# Patient Record
Sex: Female | Born: 1950
Health system: Southern US, Community
[De-identification: ages and names within clinical notes are randomized; demographics above are authoritative.]

## PROBLEM LIST (undated history)

## (undated) DIAGNOSIS — N898 Other specified noninflammatory disorders of vagina: Secondary | ICD-10-CM

## (undated) DIAGNOSIS — Z23 Encounter for immunization: Secondary | ICD-10-CM

## (undated) DIAGNOSIS — I10 Essential (primary) hypertension: Secondary | ICD-10-CM

## (undated) DIAGNOSIS — K2100 Gastro-esophageal reflux disease with esophagitis, without bleeding: Secondary | ICD-10-CM

## (undated) DIAGNOSIS — C4490 Unspecified malignant neoplasm of skin, unspecified: Secondary | ICD-10-CM

## (undated) DIAGNOSIS — I4891 Unspecified atrial fibrillation: Secondary | ICD-10-CM

## (undated) DIAGNOSIS — E559 Vitamin D deficiency, unspecified: Secondary | ICD-10-CM

## (undated) DIAGNOSIS — D485 Neoplasm of uncertain behavior of skin: Secondary | ICD-10-CM

## (undated) DIAGNOSIS — E063 Autoimmune thyroiditis: Secondary | ICD-10-CM

## (undated) DIAGNOSIS — R7309 Other abnormal glucose: Secondary | ICD-10-CM

## (undated) DIAGNOSIS — E785 Hyperlipidemia, unspecified: Secondary | ICD-10-CM

## (undated) DIAGNOSIS — R195 Other fecal abnormalities: Secondary | ICD-10-CM

## (undated) DIAGNOSIS — M7989 Other specified soft tissue disorders: Secondary | ICD-10-CM

## (undated) DIAGNOSIS — F32A Depression, unspecified: Secondary | ICD-10-CM

## (undated) DIAGNOSIS — J45902 Unspecified asthma with status asthmaticus: Secondary | ICD-10-CM

## (undated) DIAGNOSIS — N952 Postmenopausal atrophic vaginitis: Secondary | ICD-10-CM

## (undated) DIAGNOSIS — F419 Anxiety disorder, unspecified: Secondary | ICD-10-CM

## (undated) DIAGNOSIS — I509 Heart failure, unspecified: Secondary | ICD-10-CM

## (undated) DIAGNOSIS — E039 Hypothyroidism, unspecified: Secondary | ICD-10-CM

## (undated) DIAGNOSIS — L129 Pemphigoid, unspecified: Secondary | ICD-10-CM

## (undated) DIAGNOSIS — J45909 Unspecified asthma, uncomplicated: Secondary | ICD-10-CM

## (undated) DIAGNOSIS — F329 Major depressive disorder, single episode, unspecified: Secondary | ICD-10-CM

## (undated) DIAGNOSIS — Z79899 Other long term (current) drug therapy: Secondary | ICD-10-CM

## (undated) DIAGNOSIS — Z6826 Body mass index (BMI) 26.0-26.9, adult: Secondary | ICD-10-CM

## (undated) HISTORY — DX: Other abnormal glucose: R73.09

## (undated) HISTORY — DX: Unspecified asthma, uncomplicated: J45.909

## (undated) HISTORY — DX: Heart failure, unspecified: I50.9

## (undated) HISTORY — DX: Postmenopausal atrophic vaginitis: N95.2

## (undated) HISTORY — DX: Hypothyroidism, unspecified: E03.9

## (undated) HISTORY — DX: Other specified noninflammatory disorders of vagina: N89.8

## (undated) HISTORY — PX: SQUAMOUS CELL CARCINOMA EXCISION: SHX2433

## (undated) HISTORY — DX: Depression, unspecified: F32.A

## (undated) HISTORY — DX: Neoplasm of uncertain behavior of skin: D48.5

## (undated) HISTORY — DX: Major depressive disorder, single episode, unspecified: F32.9

## (undated) HISTORY — PX: OTHER SURGICAL HISTORY: SHX169

## (undated) HISTORY — DX: Unspecified malignant neoplasm of skin, unspecified: C44.90

## (undated) HISTORY — DX: Unspecified atrial fibrillation: I48.91

## (undated) HISTORY — DX: Hyperlipidemia, unspecified: E78.5

## (undated) HISTORY — DX: Autoimmune thyroiditis: E06.3

## (undated) HISTORY — DX: Gastro-esophageal reflux disease with esophagitis, without bleeding: K21.00

## (undated) HISTORY — DX: Anxiety disorder, unspecified: F41.9

## (undated) HISTORY — DX: Other fecal abnormalities: R19.5

## (undated) HISTORY — DX: Encounter for immunization: Z23

## (undated) HISTORY — DX: Vitamin D deficiency, unspecified: E55.9

## (undated) HISTORY — DX: Essential (primary) hypertension: I10

## (undated) HISTORY — DX: Other specified soft tissue disorders: M79.89

## (undated) HISTORY — DX: Body mass index (BMI) 26.0-26.9, adult: Z68.26

## (undated) HISTORY — DX: Gastro-esophageal reflux disease with esophagitis: K21.0

## (undated) HISTORY — DX: Other long term (current) drug therapy: Z79.899

## (undated) HISTORY — DX: Pemphigoid, unspecified: L12.9

## (undated) HISTORY — DX: Unspecified asthma with status asthmaticus: J45.902

---
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
## 1972-03-02 HISTORY — PX: HERNIA REPAIR: SHX51

## 1980-03-02 HISTORY — PX: ABDOMINAL HYSTERECTOMY: SHX81

## 1998-01-02 ENCOUNTER — Inpatient Hospital Stay (HOSPITAL_COMMUNITY): Admission: EM | Admit: 1998-01-02 | Discharge: 1998-01-03 | Payer: Self-pay | Admitting: Emergency Medicine

## 2007-05-01 ENCOUNTER — Emergency Department (HOSPITAL_COMMUNITY): Admission: EM | Admit: 2007-05-01 | Discharge: 2007-05-01 | Payer: Self-pay | Admitting: Emergency Medicine

## 2007-05-06 ENCOUNTER — Ambulatory Visit (HOSPITAL_COMMUNITY): Admission: RE | Admit: 2007-05-06 | Discharge: 2007-05-06 | Payer: Self-pay | Admitting: Orthopaedic Surgery

## 2007-05-13 ENCOUNTER — Ambulatory Visit (HOSPITAL_COMMUNITY): Admission: RE | Admit: 2007-05-13 | Discharge: 2007-05-14 | Payer: Self-pay | Admitting: Orthopaedic Surgery

## 2009-09-03 IMAGING — CR DG ANKLE COMPLETE 3+V*R*
3 series · 3 of 3 positions shown · non-contrast
Comparison: none

CLINICAL DATA: Fall. 
 RIGHT ANKLE ? 3 VIEW ([DATE] HOURS):

[t ankle joint ap right]
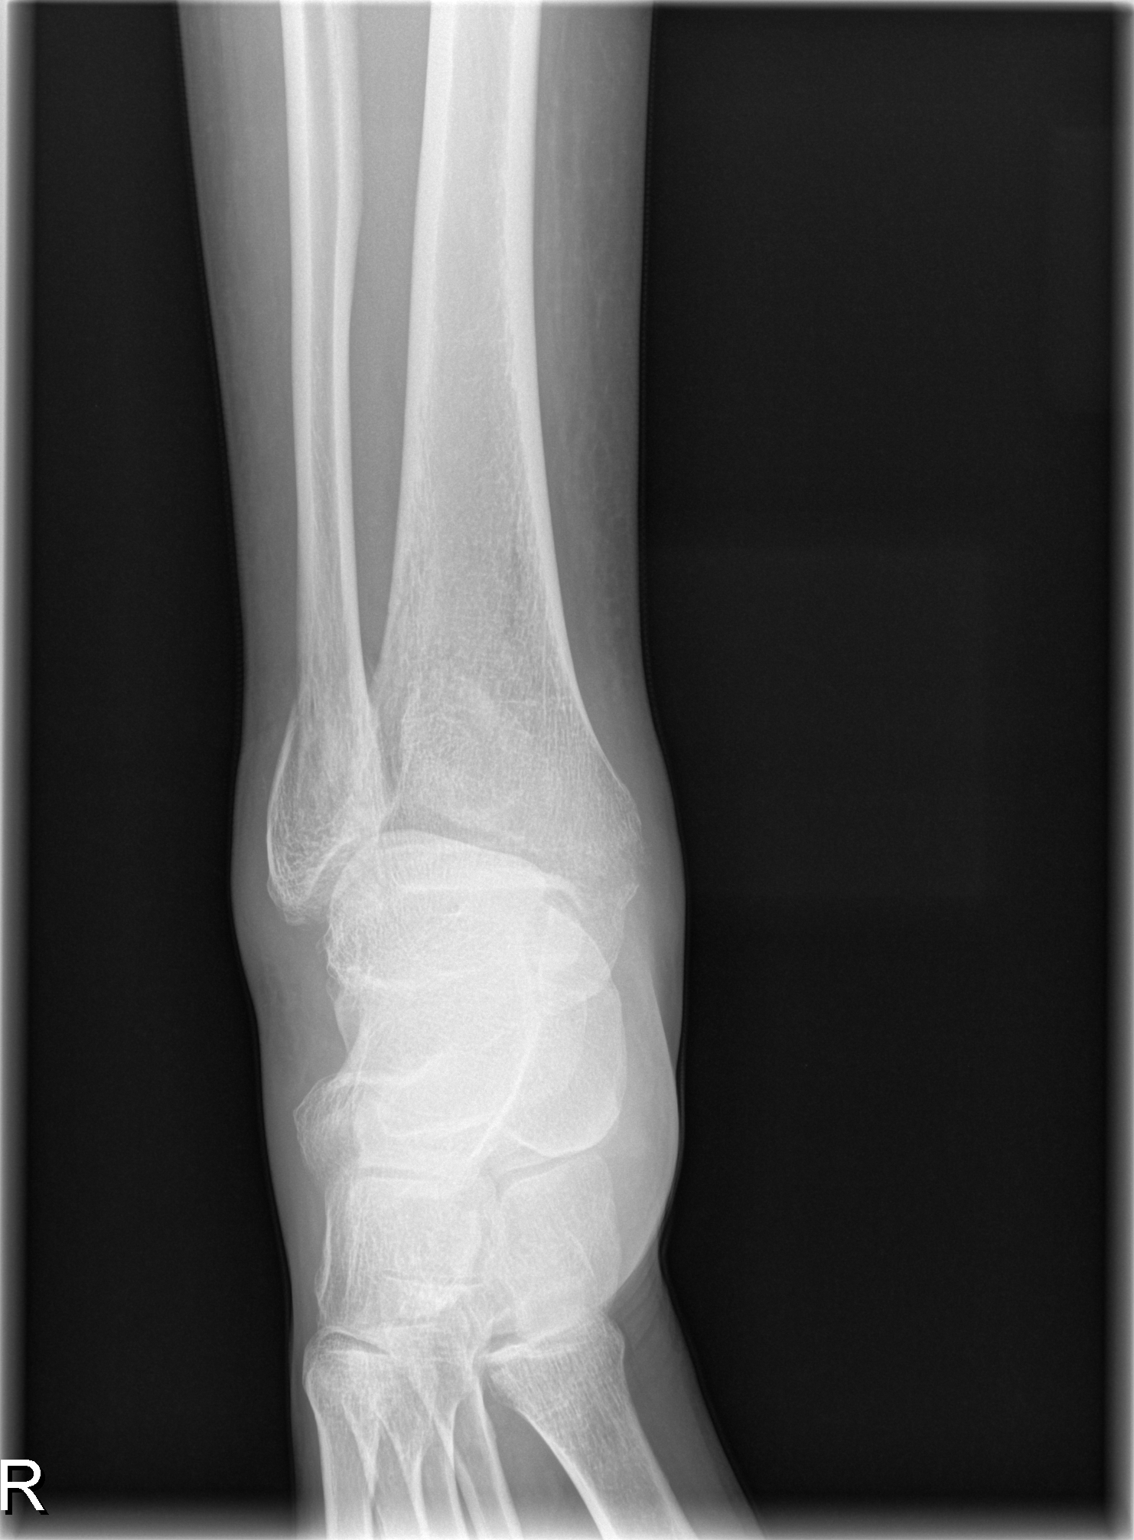

[t ankle joint oblique right]
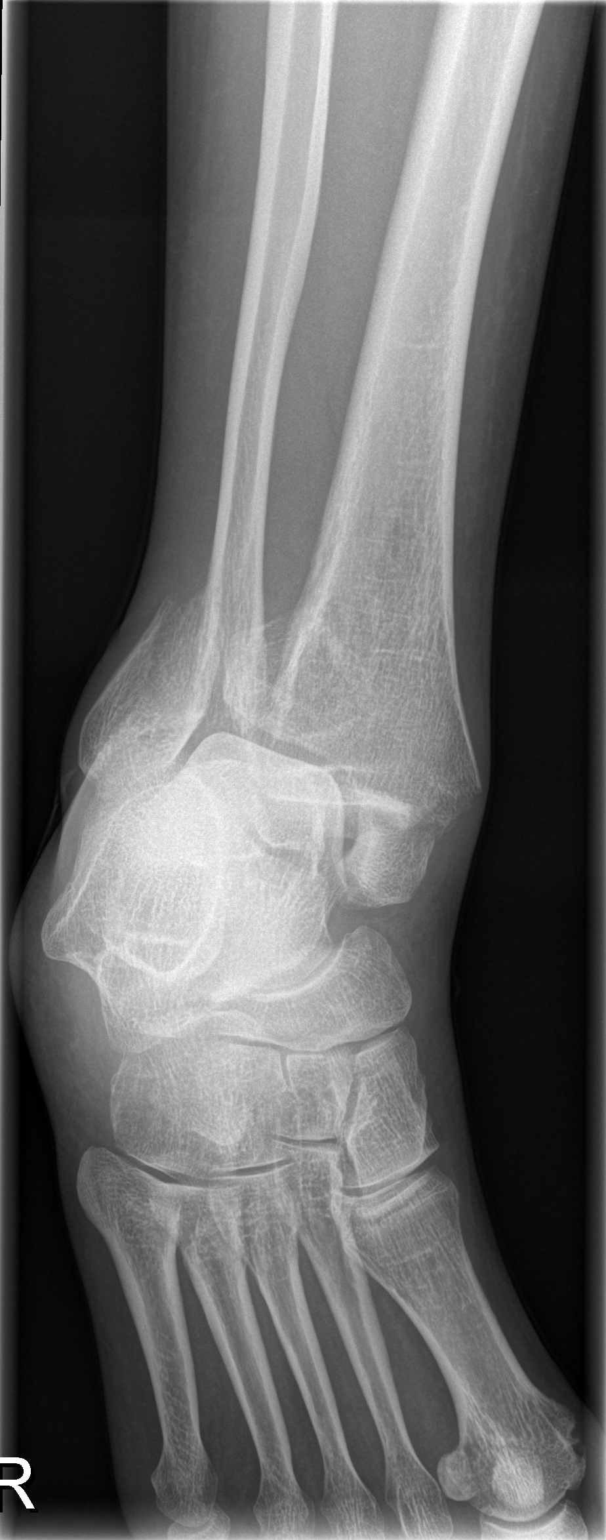

[t ankle joint lat right]
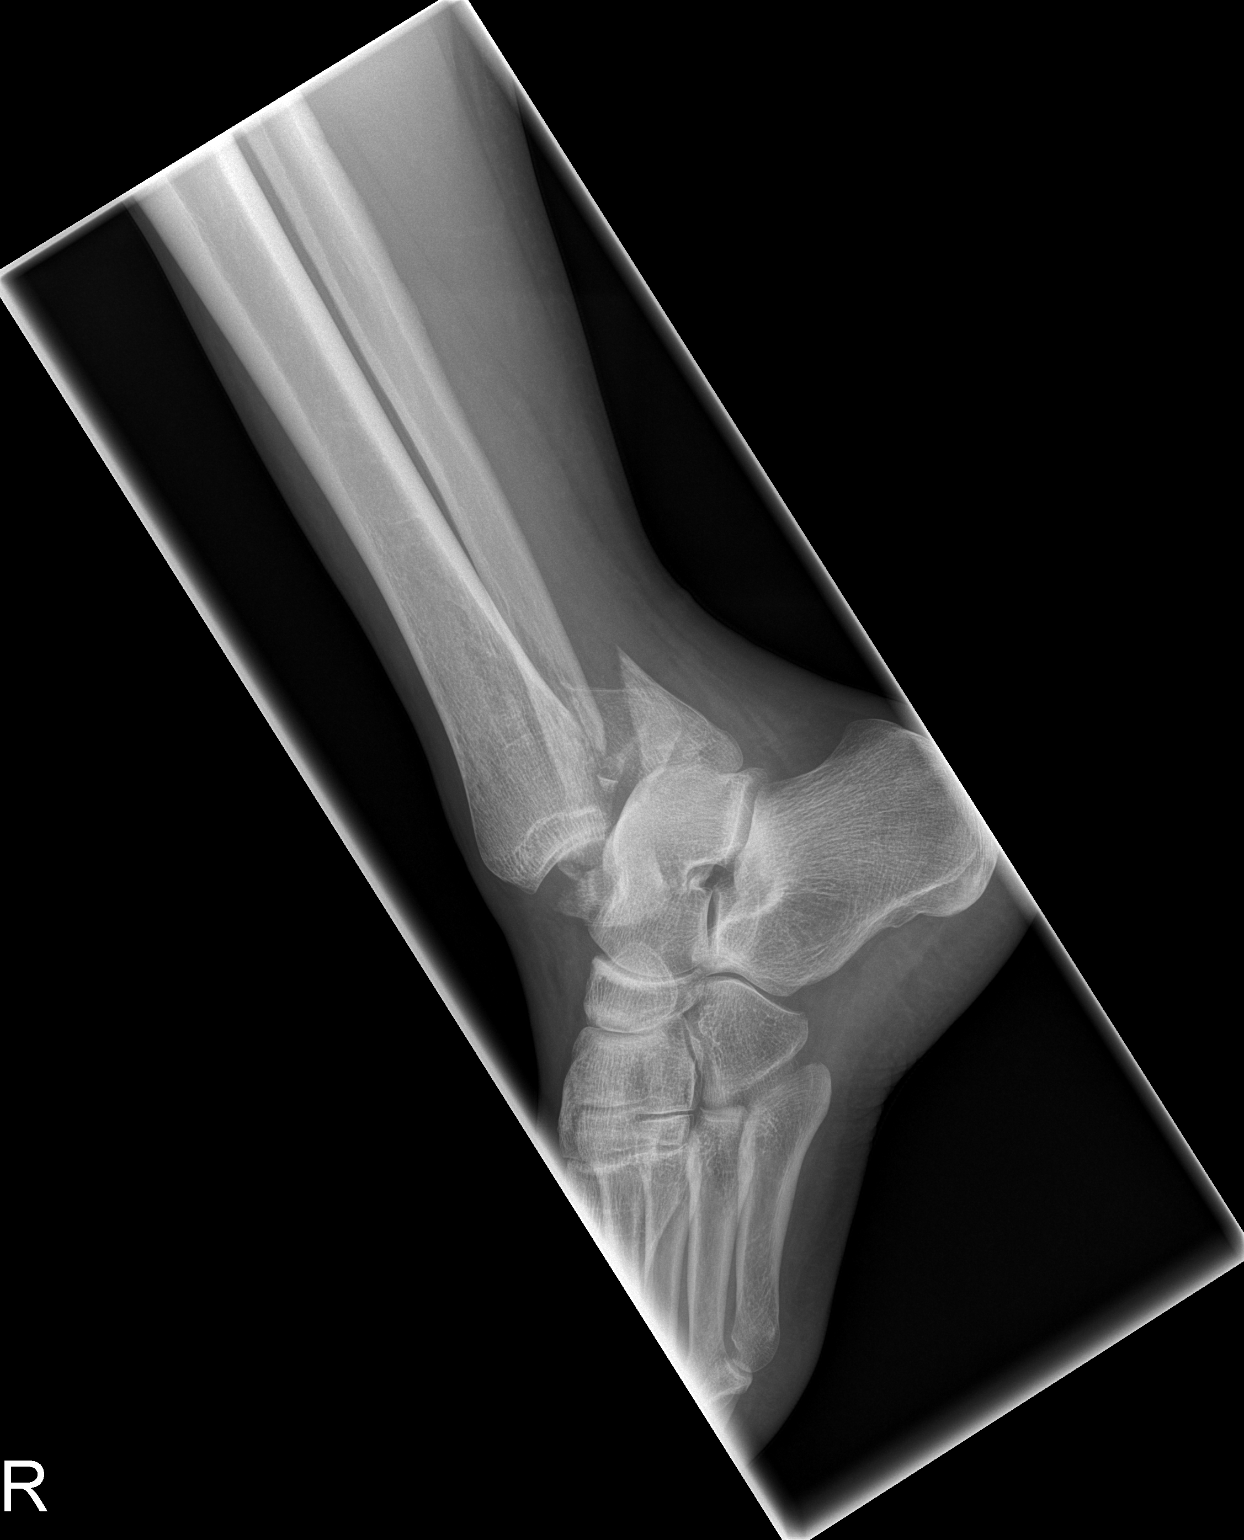

[3 of 3 positions shown; findings below may reference images not displayed]

FINDINGS: There is a complex fracture and dislocation of the ankle joint.  A distal fibula fracture is seen extending from the tibial plafond superiorly and posteriorly.  It is comminuted.  There is also a fracture of the medial malleolus and posterior malleolus with marked displacement.  The tibial plafond is subluxated 1 ?? medially with respect to the talar dome.  The calcaneus and talus are grossly intact.  Metatarsals are intact.
IMPRESSION: Complex trimalleolar fracture and dislocation of the ankle joint.

## 2009-09-08 IMAGING — CR DG CHEST 2V
2 series · 2 of 2 positions shown · non-contrast
Comparison: none

CLINICAL DATA: Preop respiratory exam for trimalleolar fracture.  Asthma.
 CHEST X-RAY:
 Two views of the chest show the lungs to be clear.  The heart is within normal limits in size.  No bony abnormality is seen.  The lungs are hyperaerated suggestive of COPD.

[view not recorded (1 of 2)]
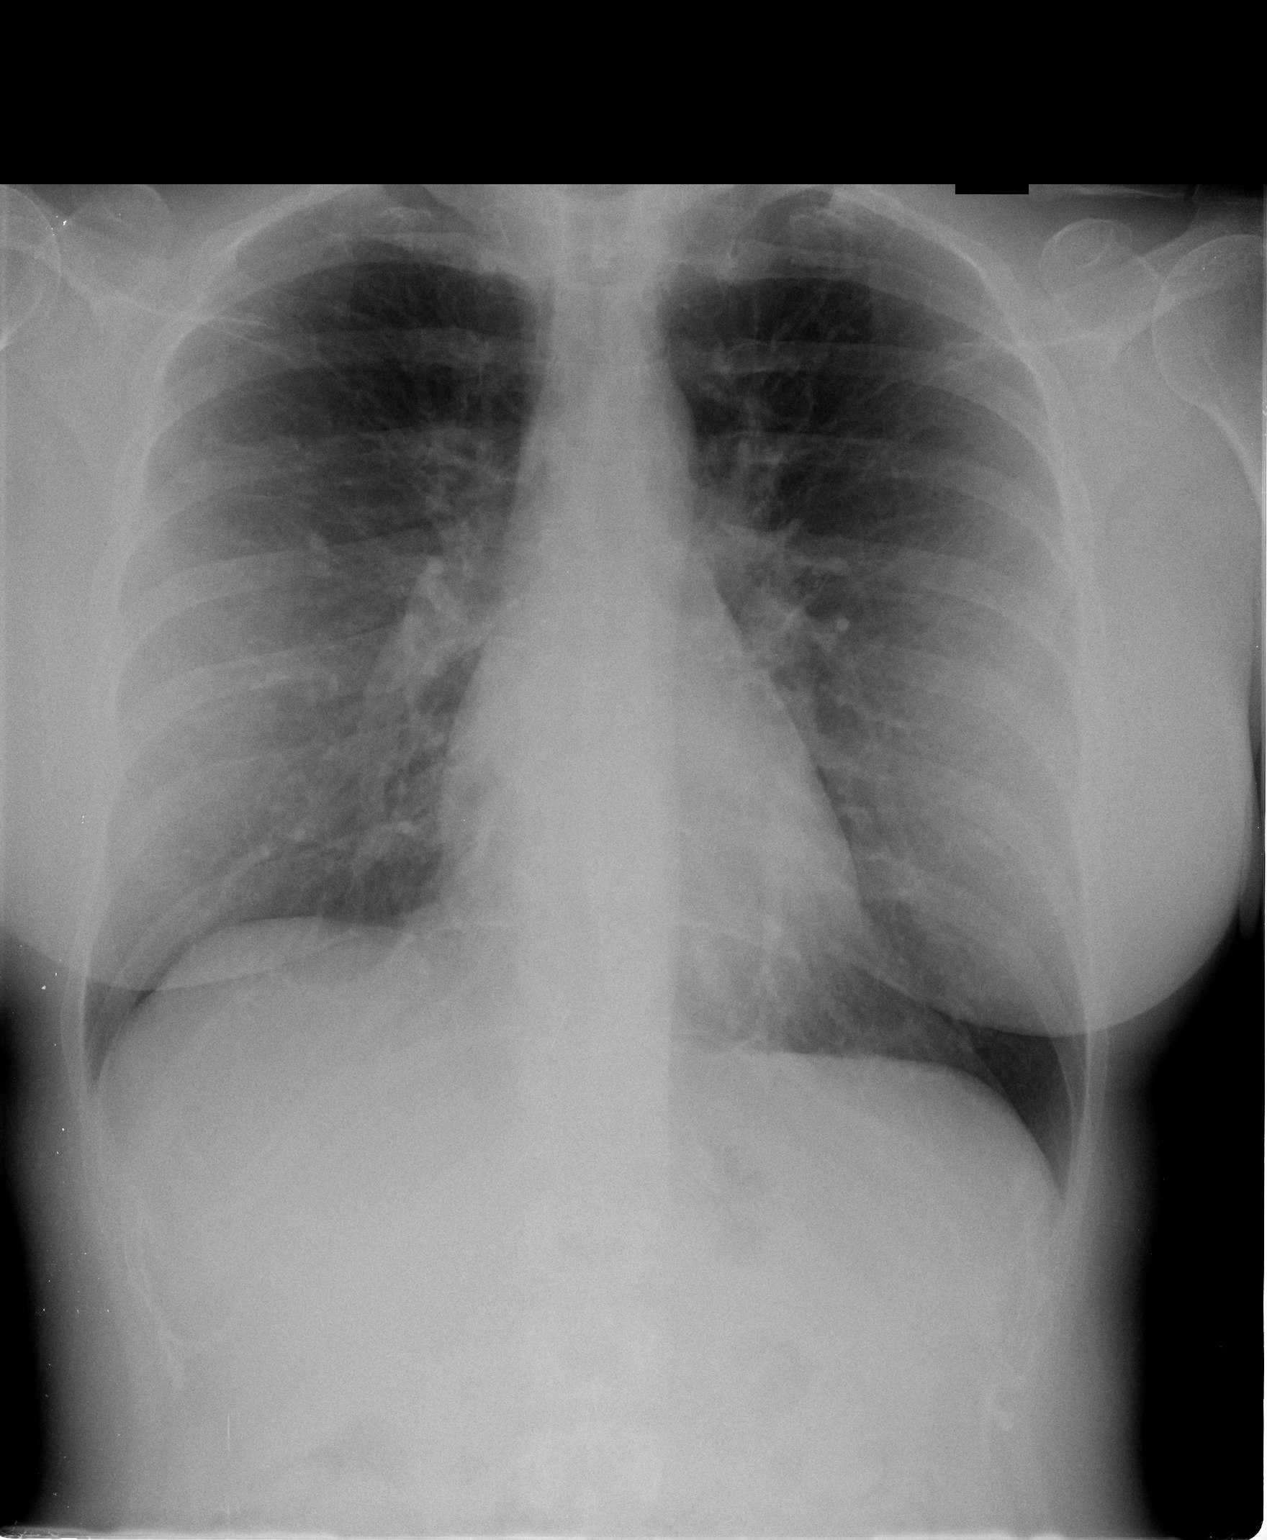

[view not recorded (2 of 2)]
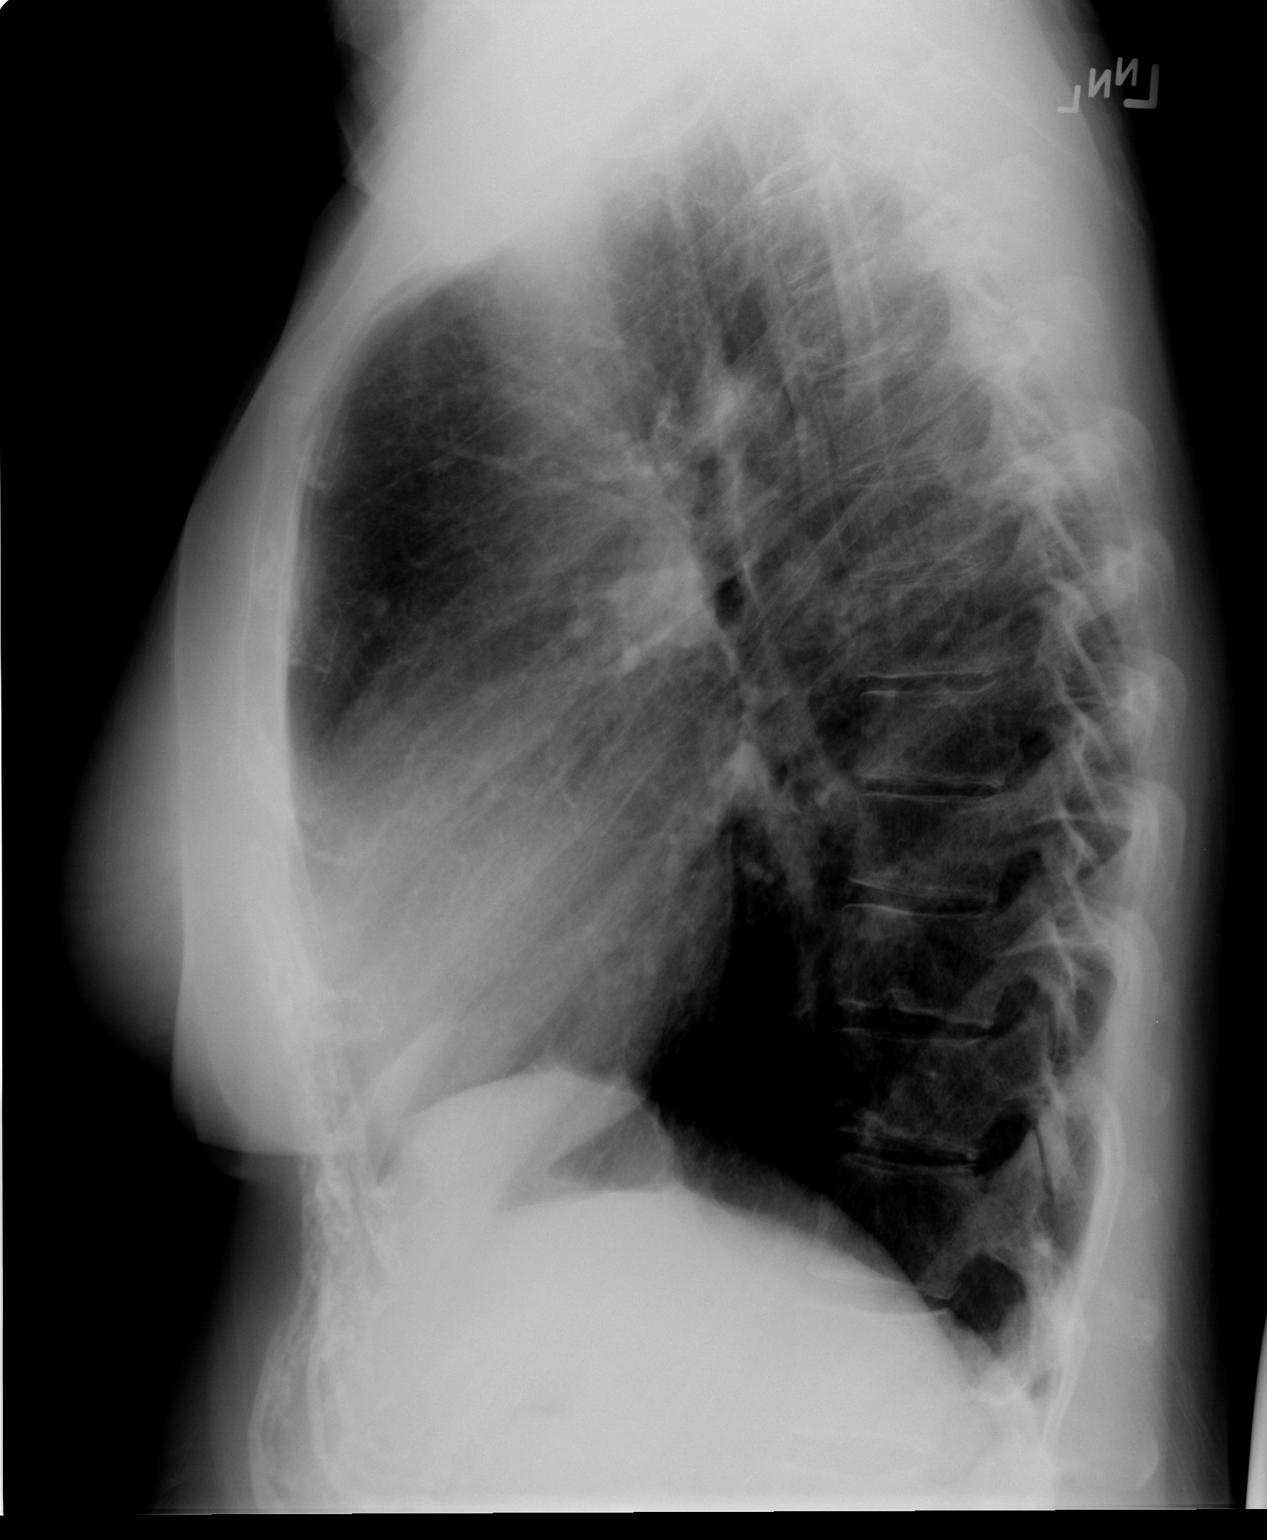

[2 of 2 positions shown; findings below may reference images not displayed]

IMPRESSION: No active lung disease.  Hyperaeration.

## 2010-07-15 NOTE — Op Note (Signed)
NAMEOREAN, GIARRATANO              ACCOUNT NO.:  [id number]   MEDICAL RECORD NO.:  [id number]          PATIENT TYPE:  AMB   LOCATION:  SDS                          FACILITY:  [facility]   PHYSICIAN:  Vanita Panda. Magnus Ivan, M.D.DATE OF BIRTH:  04/09/1950   DATE OF PROCEDURE:  05/06/2007  DATE OF DISCHARGE:  05/06/2007                               OPERATIVE REPORT   PREOPERATIVE DIAGNOSIS:  Severe trimalleolar ankle fracture dislocation  status post closed reduction.   POSTOPERATIVE DIAGNOSIS:  1. Severe trimalleolar ankle fracture dislocation status post closed      reduction.  2. Severe full thickness fracture blister circumferentially around      right ankle.   PROCEDURE:  Unroofing of fracture blisters with Xeroform and well padded  splint placement.   SURGEON:  Vanita Panda. Magnus Ivan, M.D.   ANESTHESIA:  1. Right leg popliteal block.  2. General.   COMPLICATIONS:  None.   INDICATIONS:  Briefly, Ms. Fritchman is a 60 year old female who this  past Sunday 5 days ago sustained a fracture dislocation to her right  ankle.  She was found to have a trimalleolar ankle fracture.  This  injury occurred around 10 a.m.  She did not present to our emergency  room some time until after noon and x-rays were obtained apparently  after 1.  I was called after 2:00 in the afternoon and came about 15  minutes.  Directly after seeing her I was able to perform a block in the  ankle with some Marcaine and easily reduced the ankle.  I placed her in  a well padded splint and due to the swelling she had in her ankle set  her up for surgery 5 days later to allow for elevation and the swelling  to reduce.  She said she was doing well over this time and wanted to  proceed with surgery.   PROCEDURE IN DETAIL:  A popliteal block was obtained after marking the  right ankle but I did not take down the splint due to the unstable  nature of her injury prior to taking her back to the operating room.  The  risks and benefits of the surgery were explained to her and she  agreed to proceed with surgery.   After informed consent was obtained appropriate right leg was marked and  popliteal block was obtained and then she was brought to the operating  room and laid supine on the operating table.  General anesthesia was  obtained via an LMA then I took down the splint and noticed  circumferential fracture blisters that were almost full thickness around  the ankle at all areas where incisions were planned.  Thus I felt the  need to abort the case at that standpoint because I could not safely  make skin incisions through these blisters.  I used a small needle and  evacuated the fluid from all the fracture blisters and unroofed the  tissue.  I placed Xeroform over these and then assessed the fracture  under direct fluoroscopy and had it in a completely reduced position.  I  placed an abundant amount  of well padded dressing on the ankle and  placed her back into a plaster splint.  She was awakened, extubated and  taken to the recovery room in stable condition.  Postoperatively I  talked to her and her husband about allowing the soft tissue  wounds to calm down before proceeding with surgery in at least another  week.  Again, the risks and benefits of this were explained to her and  well understood and she agreed to allow me to delay the surgery until a  better time when the soft tissue would allow for incisions.      Vanita Panda. Magnus Ivan, M.D.  Electronically Signed     CYB/MEDQ  D:  05/06/2007  T:  05/08/2007  Job:  [id number]

## 2010-07-15 NOTE — Op Note (Signed)
NAMESARY, BOGIE              ACCOUNT NO.:  [id number]   MEDICAL RECORD NO.:  [id number]          PATIENT TYPE:  OIB   LOCATION:  5017                         FACILITY:  [facility]   PHYSICIAN:  Vanita Panda. Magnus Ivan, M.D.DATE OF BIRTH:  08/10/1950   DATE OF PROCEDURE:  05/13/2007  DATE OF DISCHARGE:                               OPERATIVE REPORT   PREOPERATIVE DIAGNOSIS:  Right complex trimalleolar ankle fracture.   POSTOPERATIVE DIAGNOSIS:  Right complex trimalleolar ankle fracture.   PROCEDURE:  Open reduction internal fixation of right distal fibula and  right medial malleolus.   SURGEON:  Vanita Panda. Magnus Ivan, M.D.   ANESTHESIA:  1. Right popliteal block.  2. General.   ANTIBIOTICS:  600 mg of IV clindamycin.   TOURNIQUET TIME:  1 hour and 45 minutes.   BLOOD LOSS:  Minimal.   INDICATION:  Ms. Andrea Harding is a 60 year old female that I saw on the 1st  or 2nd of March after she had a mechanical fall.  She was seen in our  emergency room and found to have a complex trimalleolar ankle fracture  distal to the patient.  I performed a closed reduction of this and had  an almost standard time of reduction and placed her in a splint.  I then  set her up for surgery the following week or by the end of the week.  Her injury was on a Sunday, and I put her in a splint with elevation and  set her up for surgery on Friday.  She was taken to the operating room  on that Friday, and when the splint was taken down, she had incredibly  abundant fracture blisters all along the medial and lateral aspect of  the ankle and posteriorly, completely prohibiting me from making any  type of incisions.  The ankle was still in a reduced position, so I put  Xeroform  on these wounds and put her back in a splint.  I subsequently  followed her up this past Wednesday in the office, and the fracture  blisters were healing nicely and safely for proceeding with open  reduction internal fixation of the  ankle fracture.  The risks and  benefits of this were explained to her at length, and she agrees to  proceeding with surgery.   PROCEDURE DESCRIPTION:  After informed consent was obtained, the  appropriate right ankle was marked.  Popliteal block was obtained by  anesthesia.  She was then brought to the operating room and placed  supine on the operating table.  General anesthesia was then obtained.  A  nonsterile tourniquet was placed around her upper right thigh.  Her  foot, ankle and leg were prepped and draped in Betadine scrub and paint.  The fracture blisters did almost heal completely.  At this point, I felt  it was safe to proceed with surgery.  A time out was called, and she was  identified as the correct patient, correct extremity.  I then used an  Esmarch to wrap out the right ankle, and the tourniquet was inflated to  300 mm of pressure.  I then  made an incision directly over the fibula  and carried this down to the fracture site.  This was a comminuted  fibula fracture with a oblique component as well.  I was able to take  down the fracture and found that her bone was quite soft.  This helped  me with the decision to place a locking plate fixation to better secure  the bone.  I picked the [facility] lateral fibular plate, and with  the fracture held in reduced position, fashioned this along the lateral  cortex and secured this with three distal locking screws and three  proximal locking screws.  I attempted to lag the fracture first before  this, but the bone was just soft with the reduction forceps that I was  able to hold it reduced as I put the lateral plate.  I then did  supplement this with a long lag screw that held this into a reduced  position.  This was all verified under fluoroscopic guidance.  I then  copiously irrigated the medial wound and closed the deep tissue over the  plate with 0 Vicryl followed by 2-0 Vicryl in subcutaneous tissue and  interrupted 2-0  nylon on the skin.  I then turned attention to the  medial side of the ankle where an incision was made directly over the  medial malleolar piece which was again a comminuted piece.  Her medial  malleolus seemed to have more length to it than I recognized, and she  may have had a previous ankle injury to this.  This medial malleolus was  crushed in two pieces and having it cleaned of fracture debris, I was  able to tease this into a reduced positive  position. I then made the  decision to place two cannulated screws in as near a parallel form as I  could to hold this medial malleolar piece in reduced position.  Two 4.0  mm screws were placed measuring 50 mm in length.  This did hold the  medial malleolus piece intact.  I then put the ankle through a range of  motion, and it felt stable and under direct fluoroscopic guidance, it  looked like the posterior piece was less than 20% of the articular  surface, so decision was made to not address this small piece and I  would treat this with splinting.  This medial wound was then copiously  irrigated, and I closed the deep tissues with 0 Vicryl followed by 2-0  Vicryl in subcutaneous tissue and interrupted  2-0 nylon in the skin.  I  then placed her leg in a well-padded plaster splint with plaster  stirrups.  The tourniquet was deflated at 1 hour and 45 minutes.  The  toes did pink up nicely.  The patient was then awakened, extubated and  taken to the recovery room in stable condition.  All final counts were  correct, and there were no complications noted.      Vanita Panda. Magnus Ivan, M.D.  Electronically Signed     CYB/MEDQ  D:  05/13/2007  T:  05/13/2007  Job:  [id number]

## 2010-07-15 NOTE — Consult Note (Signed)
NAMEANIAYAH, ALANIZ              ACCOUNT NO.:  [id number]   MEDICAL RECORD NO.:  [id number]          PATIENT TYPE:  EMS   LOCATION:  MINO                         FACILITY:  [facility]   PHYSICIAN:  Vanita Panda. Magnus Ivan, M.D.DATE OF BIRTH:  [birth year]   DATE OF CONSULTATION:  05/01/2007  DATE OF DISCHARGE:                                 CONSULTATION   REASON FOR CONSULTATION:  Right ankle fracture dislocation.   HISTORY OF PRESENT ILLNESS:  Briefly, Ms. Boggan is a 60 year old  female who sustained a mechanical fall earlier this morning at around 10  a.m. This was coming out of her home in [place].  Her husband managed  to go by his church and get crutches, and then they eventually by noon  or so came to the [facility] ER.  X-rays were eventually obtained, and  then I was called at about 3:15 to come evaluate the patient.  I came to  see the patient within 10 minutes after getting called, and she was  found to have a trimalleolar ankle fracture dislocation which is about 5  hours out from her original injury.  Her complaint was only that of  right ankle pain, numbness, and tingling, and otherwise she denied any  other injuries or syncopal episodes.   REVIEW OF SYSTEMS:  Negative for chest pain, shortness of breath, fever,  chills, nausea, or vomiting, and positive for right ankle pain and foot  numbness.   PAST MEDICAL HISTORY:  1. Thyroid disease.  2. Anxiety.   MEDICATIONS:  1. Aspirin.  2. Levothroid 0.15.  3. Anxiety medication.   ALLERGIES:  1. PENICILLIN.  2. SULFA.   SOCIAL HISTORY:  She does work for the [employer], and she  denies any tobacco, alcohol, or drug use.  Her husband is with her.   PHYSICAL EXAMINATION:  VITAL SIGNS:  She is afebrile, with stable vital  signs.  GENERAL:  She is alert and oriented x3, in no acute distress but obvious  discomfort.  HEENT:  Normocephalic, atraumatic.  Pupils equal and reactive light.  Extraocular  muscles intact.  NECK:  Supple.  LUNGS:  Clear to auscultation bilaterally.  HEART:  Regular rate and rhythm.  ABDOMEN:  Soft, nontender, nondistended, with normoactive bowel sounds.  EXTREMITIES:  Examination of the right lower extremity shows a mottled  foot, with significant bruising and swelling laterally and medially.  It  is obvious dislocated.  Pulses are very thready in the foot.  She is  able to move her toes, but it is numb.  The skin is intact.   X-rays were reviewed show a significant trimalleolar ankle fracture  dislocation.   IMPRESSION:  This is a 60 year old female with a right trimalleolar  unstable ankle fracture dislocation.   PLAN:  After prepping her ankle with Betadine and alcohol, I provided  injection of 5 mL of plain lidocaine followed by 5 mL of 0.25% plain  Sensorcaine.  I then was able to give her morphine and Versed, and  easily reduced the ankle.  She reported much relief in her pain with the  ankle in  a reduced position.  I then placed a well-padded plaster splint  with the posterior aspect in stirrups.  Once this hardened,  postreduction films were obtained and showed near anatomic reduction of  the ankle.  Once she was back from x-ray, she reported much improved  sensation in her feet.  Her toes were well perfused, and she move her  toes easily.   Due to the swelling, I am going to set up surgery as an outpatient for  later in the week after she has had several days of elevation and ice.  I will give her a prescription for Percocet as well as Robaxin and  encouraged her to take ibuprofen.  I have given her a note for work as  well.  My office will be in touch with her in the next few days for  scheduling surgery as an outpatient later this week.  However, I will  keep her for 23-hour observation for crutch training or training with a  walker as well as pain control following surgery.  She and her husband  seem to indicate understanding of my  instructions.      Vanita Panda. Magnus Ivan, M.D.  Electronically Signed     CYB/MEDQ  D:  05/01/2007  T:  05/02/2007  Job:  [id number]

## 2010-11-24 LAB — CBC
Hemoglobin: 11.3 — ABNORMAL LOW
RBC: 3.69 — ABNORMAL LOW
WBC: 7.2

## 2010-11-24 LAB — BASIC METABOLIC PANEL
CO2: 27
Calcium: 9.1
Creatinine, Ser: 0.68
GFR calc Af Amer: >60
GFR calc non Af Amer: >60
Sodium: 140

## 2015-08-16 ENCOUNTER — Other Ambulatory Visit (HOSPITAL_COMMUNITY): Payer: Self-pay | Admitting: Family Medicine

## 2015-08-16 DIAGNOSIS — I739 Peripheral vascular disease, unspecified: Secondary | ICD-10-CM

## 2015-08-20 ENCOUNTER — Ambulatory Visit (HOSPITAL_COMMUNITY)
Admission: RE | Admit: 2015-08-20 | Discharge: 2015-08-20 | Disposition: A | Discharge status: Home / Self Care | Payer: Managed Care, Other (non HMO) | Source: Ambulatory Visit | Attending: Family Medicine | Admitting: Family Medicine

## 2015-08-20 DIAGNOSIS — I739 Peripheral vascular disease, unspecified: Secondary | ICD-10-CM | POA: Diagnosis not present

## 2015-08-20 DIAGNOSIS — R0989 Other specified symptoms and signs involving the circulatory and respiratory systems: Secondary | ICD-10-CM | POA: Diagnosis not present

## 2015-08-20 NOTE — Progress Notes (Signed)
VASCULAR LAB PRELIMINARY  ARTERIAL  ABI completed:    RIGHT    LEFT    PRESSURE WAVEFORM  PRESSURE WAVEFORM  BRACHIAL 142 Triphaisc BRACHIAL 156 Triphasic  DP 154 Triphasic DP 157 Triphasic  PT 174 Triphasic PT 166 Triphasic  GREAT TOE 0.69 normal GREAT TOE 0.76 Normal    RIGHT LEFT  ABI 1.1 1.0     Carra Brindley D, RVT 08/20/2015, 3:55 PM

## 2016-05-29 DIAGNOSIS — Z1231 Encounter for screening mammogram for malignant neoplasm of breast: Secondary | ICD-10-CM | POA: Diagnosis not present

## 2016-06-02 DIAGNOSIS — R922 Inconclusive mammogram: Secondary | ICD-10-CM | POA: Diagnosis not present

## 2016-06-05 DIAGNOSIS — E559 Vitamin D deficiency, unspecified: Secondary | ICD-10-CM | POA: Diagnosis not present

## 2016-06-05 DIAGNOSIS — N952 Postmenopausal atrophic vaginitis: Secondary | ICD-10-CM | POA: Diagnosis not present

## 2016-06-05 DIAGNOSIS — F419 Anxiety disorder, unspecified: Secondary | ICD-10-CM | POA: Diagnosis not present

## 2016-06-05 DIAGNOSIS — F329 Major depressive disorder, single episode, unspecified: Secondary | ICD-10-CM | POA: Diagnosis not present

## 2016-06-05 DIAGNOSIS — R7309 Other abnormal glucose: Secondary | ICD-10-CM | POA: Diagnosis not present

## 2016-06-05 DIAGNOSIS — E785 Hyperlipidemia, unspecified: Secondary | ICD-10-CM | POA: Diagnosis not present

## 2016-06-05 DIAGNOSIS — Z6823 Body mass index (BMI) 23.0-23.9, adult: Secondary | ICD-10-CM | POA: Diagnosis not present

## 2016-06-05 DIAGNOSIS — K210 Gastro-esophageal reflux disease with esophagitis: Secondary | ICD-10-CM | POA: Diagnosis not present

## 2016-06-05 DIAGNOSIS — E063 Autoimmune thyroiditis: Secondary | ICD-10-CM | POA: Diagnosis not present

## 2016-07-01 DIAGNOSIS — L821 Other seborrheic keratosis: Secondary | ICD-10-CM | POA: Diagnosis not present

## 2016-07-01 DIAGNOSIS — L814 Other melanin hyperpigmentation: Secondary | ICD-10-CM | POA: Diagnosis not present

## 2016-07-01 DIAGNOSIS — D1801 Hemangioma of skin and subcutaneous tissue: Secondary | ICD-10-CM | POA: Diagnosis not present

## 2016-07-01 DIAGNOSIS — L139 Bullous disorder, unspecified: Secondary | ICD-10-CM | POA: Diagnosis not present

## 2016-07-01 DIAGNOSIS — L138 Other specified bullous disorders: Secondary | ICD-10-CM | POA: Diagnosis not present

## 2016-07-01 DIAGNOSIS — Z85828 Personal history of other malignant neoplasm of skin: Secondary | ICD-10-CM | POA: Diagnosis not present

## 2016-07-21 DIAGNOSIS — S8011XA Contusion of right lower leg, initial encounter: Secondary | ICD-10-CM | POA: Diagnosis not present

## 2016-08-27 DIAGNOSIS — R195 Other fecal abnormalities: Secondary | ICD-10-CM | POA: Diagnosis not present

## 2016-08-27 DIAGNOSIS — F329 Major depressive disorder, single episode, unspecified: Secondary | ICD-10-CM | POA: Diagnosis not present

## 2016-08-27 DIAGNOSIS — J029 Acute pharyngitis, unspecified: Secondary | ICD-10-CM | POA: Diagnosis not present

## 2016-08-27 DIAGNOSIS — E559 Vitamin D deficiency, unspecified: Secondary | ICD-10-CM | POA: Diagnosis not present

## 2016-08-27 DIAGNOSIS — E063 Autoimmune thyroiditis: Secondary | ICD-10-CM | POA: Diagnosis not present

## 2016-08-27 DIAGNOSIS — Z6824 Body mass index (BMI) 24.0-24.9, adult: Secondary | ICD-10-CM | POA: Diagnosis not present

## 2016-08-27 DIAGNOSIS — N390 Urinary tract infection, site not specified: Secondary | ICD-10-CM | POA: Diagnosis not present

## 2016-08-27 DIAGNOSIS — R7309 Other abnormal glucose: Secondary | ICD-10-CM | POA: Diagnosis not present

## 2016-08-27 DIAGNOSIS — R300 Dysuria: Secondary | ICD-10-CM | POA: Diagnosis not present

## 2016-08-27 DIAGNOSIS — E785 Hyperlipidemia, unspecified: Secondary | ICD-10-CM | POA: Diagnosis not present

## 2016-09-23 DIAGNOSIS — D485 Neoplasm of uncertain behavior of skin: Secondary | ICD-10-CM | POA: Diagnosis not present

## 2016-09-23 DIAGNOSIS — L989 Disorder of the skin and subcutaneous tissue, unspecified: Secondary | ICD-10-CM | POA: Diagnosis not present

## 2016-10-02 DIAGNOSIS — F4320 Adjustment disorder, unspecified: Secondary | ICD-10-CM | POA: Diagnosis not present

## 2016-10-29 DIAGNOSIS — L129 Pemphigoid, unspecified: Secondary | ICD-10-CM | POA: Diagnosis not present

## 2016-10-29 DIAGNOSIS — E063 Autoimmune thyroiditis: Secondary | ICD-10-CM | POA: Diagnosis not present

## 2016-10-29 DIAGNOSIS — Z6824 Body mass index (BMI) 24.0-24.9, adult: Secondary | ICD-10-CM | POA: Diagnosis not present

## 2016-10-29 DIAGNOSIS — F419 Anxiety disorder, unspecified: Secondary | ICD-10-CM | POA: Diagnosis not present

## 2016-10-29 DIAGNOSIS — R7309 Other abnormal glucose: Secondary | ICD-10-CM | POA: Diagnosis not present

## 2016-10-29 DIAGNOSIS — E559 Vitamin D deficiency, unspecified: Secondary | ICD-10-CM | POA: Diagnosis not present

## 2016-10-29 DIAGNOSIS — E785 Hyperlipidemia, unspecified: Secondary | ICD-10-CM | POA: Diagnosis not present

## 2016-12-08 DIAGNOSIS — Z23 Encounter for immunization: Secondary | ICD-10-CM | POA: Diagnosis not present

## 2017-02-02 DIAGNOSIS — E559 Vitamin D deficiency, unspecified: Secondary | ICD-10-CM | POA: Diagnosis not present

## 2017-02-02 DIAGNOSIS — Z6825 Body mass index (BMI) 25.0-25.9, adult: Secondary | ICD-10-CM | POA: Diagnosis not present

## 2017-02-02 DIAGNOSIS — Z1331 Encounter for screening for depression: Secondary | ICD-10-CM | POA: Diagnosis not present

## 2017-02-02 DIAGNOSIS — Z9181 History of falling: Secondary | ICD-10-CM | POA: Diagnosis not present

## 2017-02-02 DIAGNOSIS — E063 Autoimmune thyroiditis: Secondary | ICD-10-CM | POA: Diagnosis not present

## 2017-02-02 DIAGNOSIS — Z23 Encounter for immunization: Secondary | ICD-10-CM | POA: Diagnosis not present

## 2017-02-02 DIAGNOSIS — R7309 Other abnormal glucose: Secondary | ICD-10-CM | POA: Diagnosis not present

## 2017-02-02 DIAGNOSIS — F329 Major depressive disorder, single episode, unspecified: Secondary | ICD-10-CM | POA: Diagnosis not present

## 2017-02-02 DIAGNOSIS — M79674 Pain in right toe(s): Secondary | ICD-10-CM | POA: Diagnosis not present

## 2017-02-02 DIAGNOSIS — F419 Anxiety disorder, unspecified: Secondary | ICD-10-CM | POA: Diagnosis not present

## 2017-02-02 DIAGNOSIS — Z79899 Other long term (current) drug therapy: Secondary | ICD-10-CM | POA: Diagnosis not present

## 2017-02-02 DIAGNOSIS — E785 Hyperlipidemia, unspecified: Secondary | ICD-10-CM | POA: Diagnosis not present

## 2017-03-29 DIAGNOSIS — Z6825 Body mass index (BMI) 25.0-25.9, adult: Secondary | ICD-10-CM | POA: Diagnosis not present

## 2017-03-29 DIAGNOSIS — J189 Pneumonia, unspecified organism: Secondary | ICD-10-CM | POA: Diagnosis not present

## 2017-03-29 DIAGNOSIS — J45902 Unspecified asthma with status asthmaticus: Secondary | ICD-10-CM | POA: Diagnosis not present

## 2017-04-26 DIAGNOSIS — D225 Melanocytic nevi of trunk: Secondary | ICD-10-CM | POA: Diagnosis not present

## 2017-04-26 DIAGNOSIS — D1801 Hemangioma of skin and subcutaneous tissue: Secondary | ICD-10-CM | POA: Diagnosis not present

## 2017-04-26 DIAGNOSIS — Z85820 Personal history of malignant melanoma of skin: Secondary | ICD-10-CM | POA: Diagnosis not present

## 2017-04-26 DIAGNOSIS — L821 Other seborrheic keratosis: Secondary | ICD-10-CM | POA: Diagnosis not present

## 2017-05-25 DIAGNOSIS — E559 Vitamin D deficiency, unspecified: Secondary | ICD-10-CM | POA: Diagnosis not present

## 2017-05-25 DIAGNOSIS — Z79899 Other long term (current) drug therapy: Secondary | ICD-10-CM | POA: Diagnosis not present

## 2017-05-25 DIAGNOSIS — E785 Hyperlipidemia, unspecified: Secondary | ICD-10-CM | POA: Diagnosis not present

## 2017-05-25 DIAGNOSIS — F329 Major depressive disorder, single episode, unspecified: Secondary | ICD-10-CM | POA: Diagnosis not present

## 2017-05-25 DIAGNOSIS — R7309 Other abnormal glucose: Secondary | ICD-10-CM | POA: Diagnosis not present

## 2017-05-25 DIAGNOSIS — L129 Pemphigoid, unspecified: Secondary | ICD-10-CM | POA: Diagnosis not present

## 2017-05-25 DIAGNOSIS — Z6825 Body mass index (BMI) 25.0-25.9, adult: Secondary | ICD-10-CM | POA: Diagnosis not present

## 2017-05-25 DIAGNOSIS — E063 Autoimmune thyroiditis: Secondary | ICD-10-CM | POA: Diagnosis not present

## 2017-05-25 DIAGNOSIS — F419 Anxiety disorder, unspecified: Secondary | ICD-10-CM | POA: Diagnosis not present

## 2017-06-11 DIAGNOSIS — Z780 Asymptomatic menopausal state: Secondary | ICD-10-CM | POA: Diagnosis not present

## 2017-06-11 DIAGNOSIS — Z1231 Encounter for screening mammogram for malignant neoplasm of breast: Secondary | ICD-10-CM | POA: Diagnosis not present

## 2017-06-30 DIAGNOSIS — E663 Overweight: Secondary | ICD-10-CM | POA: Diagnosis not present

## 2017-06-30 DIAGNOSIS — L129 Pemphigoid, unspecified: Secondary | ICD-10-CM | POA: Diagnosis not present

## 2017-06-30 DIAGNOSIS — J019 Acute sinusitis, unspecified: Secondary | ICD-10-CM | POA: Diagnosis not present

## 2017-06-30 DIAGNOSIS — F419 Anxiety disorder, unspecified: Secondary | ICD-10-CM | POA: Diagnosis not present

## 2017-06-30 DIAGNOSIS — R002 Palpitations: Secondary | ICD-10-CM | POA: Diagnosis not present

## 2017-07-02 ENCOUNTER — Other Ambulatory Visit (HOSPITAL_COMMUNITY): Payer: Self-pay | Admitting: Internal Medicine

## 2017-07-02 DIAGNOSIS — I4891 Unspecified atrial fibrillation: Secondary | ICD-10-CM

## 2017-07-06 ENCOUNTER — Ambulatory Visit (HOSPITAL_COMMUNITY)
Admission: RE | Admit: 2017-07-06 | Discharge: 2017-07-06 | Disposition: A | Discharge status: Home / Self Care | Payer: Medicare Other | Source: Ambulatory Visit | Attending: Family Medicine | Admitting: Family Medicine

## 2017-07-06 DIAGNOSIS — I082 Rheumatic disorders of both aortic and tricuspid valves: Secondary | ICD-10-CM | POA: Insufficient documentation

## 2017-07-06 DIAGNOSIS — I4891 Unspecified atrial fibrillation: Secondary | ICD-10-CM | POA: Diagnosis not present

## 2017-07-06 DIAGNOSIS — R0989 Other specified symptoms and signs involving the circulatory and respiratory systems: Secondary | ICD-10-CM | POA: Diagnosis not present

## 2017-07-06 NOTE — Progress Notes (Signed)
  Echocardiogram 2D Echocardiogram has been performed.  Bobbye Charleston 07/06/2017, 3:50 PM

## 2017-07-29 DIAGNOSIS — R239 Unspecified skin changes: Secondary | ICD-10-CM | POA: Diagnosis not present

## 2017-07-29 DIAGNOSIS — Z85820 Personal history of malignant melanoma of skin: Secondary | ICD-10-CM | POA: Diagnosis not present

## 2017-08-02 DIAGNOSIS — I4891 Unspecified atrial fibrillation: Secondary | ICD-10-CM | POA: Diagnosis not present

## 2017-08-02 DIAGNOSIS — F419 Anxiety disorder, unspecified: Secondary | ICD-10-CM | POA: Diagnosis not present

## 2017-08-02 DIAGNOSIS — H6120 Impacted cerumen, unspecified ear: Secondary | ICD-10-CM | POA: Diagnosis not present

## 2017-08-02 DIAGNOSIS — Z79899 Other long term (current) drug therapy: Secondary | ICD-10-CM | POA: Diagnosis not present

## 2017-08-02 DIAGNOSIS — E063 Autoimmune thyroiditis: Secondary | ICD-10-CM | POA: Diagnosis not present

## 2017-08-08 NOTE — Progress Notes (Deleted)
    Cardiology Office Note   Date:  08/08/2017   ID:  Andrea Harding, DOB November 09, 1950, MRN [id number]  PCP:  [name]., MD  Cardiologist:   No primary care provider on file. Referring:  ***  No chief complaint on file.     History of Present Illness: Andrea Harding is a 67 y.o. female who is referred by *** for evaluation of palpitations.  ***      No past medical history on file.  *** The histories are not reviewed yet. Please review them in the "History" navigator section and refresh this [place].   No current outpatient medications on file.   No current facility-administered medications for this visit.     Allergies:   Patient has no allergy information on record.    Social History:  The patient     Family History:  The patient's ***family history is not on file.    ROS:  Please see the history of present illness.   Otherwise, review of systems are positive for {NONE DEFAULTED:18576::"none"}.   All other systems are reviewed and negative.    PHYSICAL EXAM: VS:  There were no vitals taken for this visit. , BMI There is no height or weight on file to calculate BMI. GENERAL:  Well appearing HEENT:  Pupils equal round and reactive, fundi not visualized, oral mucosa unremarkable NECK:  No jugular venous distention, waveform within normal limits, carotid upstroke brisk and symmetric, no bruits, no thyromegaly LYMPHATICS:  No cervical, inguinal adenopathy LUNGS:  Clear to auscultation bilaterally BACK:  No CVA tenderness CHEST:  Unremarkable HEART:  PMI not displaced or sustained,S1 and S2 within normal limits, no S3, no S4, no clicks, no rubs, *** murmurs ABD:  Flat, positive bowel sounds normal in frequency in pitch, no bruits, no rebound, no guarding, no midline pulsatile mass, no hepatomegaly, no splenomegaly EXT:  2 plus pulses throughout, no edema, no cyanosis no clubbing SKIN:  No rashes no nodules NEURO:  Cranial nerves II through XII grossly intact,  motor grossly intact throughout PSYCH:  Cognitively intact, oriented to person place and time    EKG:  EKG {ACTION; IS/IS PTW:65681275} ordered today. The ekg ordered today demonstrates ***   Recent Labs: No results found for requested labs within last 8760 hours.    Lipid Panel No results found for: CHOL, TRIG, HDL, CHOLHDL, VLDL, LDLCALC, LDLDIRECT    Wt Readings from Last 3 Encounters:  No data found for Wt      Other studies Reviewed: Additional studies/ records that were reviewed today include: ***. Review of the above records demonstrates:  Please see elsewhere in the note.  ***   ASSESSMENT AND PLAN:  ***   Current medicines are reviewed at length with the patient today.  The patient {ACTIONS; HAS/DOES NOT HAVE:19233} concerns regarding medicines.  The following changes have been made:  {PLAN; NO CHANGE:13088:s}  Labs/ tests ordered today include: *** No orders of the defined types were placed in this encounter.    Disposition:   FU with ***    Signed, Minus Breeding, MD  08/08/2017 12:07 PM    [facility]

## 2017-08-11 ENCOUNTER — Ambulatory Visit: Payer: Medicare Other | Admitting: Cardiology

## 2017-08-31 DIAGNOSIS — E785 Hyperlipidemia, unspecified: Secondary | ICD-10-CM | POA: Diagnosis not present

## 2017-08-31 DIAGNOSIS — E063 Autoimmune thyroiditis: Secondary | ICD-10-CM | POA: Diagnosis not present

## 2017-08-31 DIAGNOSIS — F329 Major depressive disorder, single episode, unspecified: Secondary | ICD-10-CM | POA: Diagnosis not present

## 2017-08-31 DIAGNOSIS — I4891 Unspecified atrial fibrillation: Secondary | ICD-10-CM | POA: Diagnosis not present

## 2017-08-31 DIAGNOSIS — F419 Anxiety disorder, unspecified: Secondary | ICD-10-CM | POA: Diagnosis not present

## 2017-08-31 DIAGNOSIS — R7309 Other abnormal glucose: Secondary | ICD-10-CM | POA: Diagnosis not present

## 2017-09-20 ENCOUNTER — Telehealth: Payer: Self-pay

## 2017-09-20 NOTE — Telephone Encounter (Signed)
Called pt to verify information in pt record.

## 2017-10-06 ENCOUNTER — Encounter (INDEPENDENT_AMBULATORY_CARE_PROVIDER_SITE_OTHER): Payer: Self-pay

## 2017-10-06 ENCOUNTER — Ambulatory Visit (INDEPENDENT_AMBULATORY_CARE_PROVIDER_SITE_OTHER): Payer: Medicare Other | Admitting: Cardiovascular Disease

## 2017-10-06 VITALS — BP 126/74 | HR 84 | Ht 67.5 in | Wt 173.5 lb

## 2017-10-06 DIAGNOSIS — I4891 Unspecified atrial fibrillation: Secondary | ICD-10-CM

## 2017-10-06 DIAGNOSIS — I10 Essential (primary) hypertension: Secondary | ICD-10-CM

## 2017-10-06 DIAGNOSIS — R002 Palpitations: Secondary | ICD-10-CM

## 2017-10-06 NOTE — Progress Notes (Signed)
Cardiology Office Note   Date:  10/06/2017   ID:  Andrea Harding, DOB 07-07-1950, MRN [id number]  PCP:  [name]., MD  Cardiologist:   Jenkins Rouge, MD   No chief complaint on file.     History of Present Illness: Andrea Harding is a 67 y.o. female who presents for consultation regarding palpitations Referred by Dr Micheal Likens.   TTE reviewed from 07/06/17 EF 55-60% mild AR trivial MR LA described as mildly dilated but LA ID AP only 31 mm    History of Afib and HTN on Toprol and Xarelto  Labs ok Cr .86 TSH .835 Hct 42.6 A1c 6.3   As far as I can tell this was a new diagnosis 3-4 weeks ago during routine office visit She is asymptomatic No chest pain , fatigue palpitations or dyspnea. She does not think she can afford xarelto [place] a week ago and has bruising on her forearms and legs   Past Medical History:  Diagnosis Date  . A-fib ([place])   . Acquired autoimmune hypothyroidism   . Anxiety   . Asthma   . Asthma with bronchitis and status asthmaticus   . Blood sugar increased   . BMI 26.0-26.9,adult   . Borderline hyperlipidemia   . Chronic reflux esophagitis   . Dark stools   . Depression   . Drug therapy   . Hypertension   . Hypothyroidism   . Leg swelling   . Neoplasm of uncertain behavior of skin   . Pemphigoid   . Vaginal dryness   . Vaginitis, atrophic   . Varicella vaccination   . Vitamin D deficiency     Past Surgical History:  Procedure Laterality Date  . ABDOMINAL HYSTERECTOMY  1982  . ANGLE FOOT Right    2009  . HERNIA REPAIR  1974  . SQUAMOUS CELL CARCINOMA EXCISION Right    07/24/15  . TONSILLECTOMY  1961     Current Outpatient Medications  Medication Sig Dispense Refill  . ALPRAZolam (XANAX) 0.25 MG tablet Take 0.25 mg by mouth 3 (three) times daily as needed for anxiety.    . budesonide-formoterol (SYMBICORT) 80-4.5 MCG/ACT inhaler Inhale 2 puffs into the lungs 2 (two) times daily.    . Cholecalciferol (VITAMIN D) 2000 units CAPS Take  2,000 Units by mouth daily.    . citalopram (CELEXA) 20 MG tablet Take 60 mg by mouth daily.    Marland Kitchen levothyroxine (SYNTHROID, LEVOTHROID) 175 MCG tablet TAKE 1/2 TABLET BY MOUTH DAILY    . loperamide (IMODIUM) 2 MG capsule 2 mg. TAKE 1-2 CAPSULE BY MOUTH EVERY FOUR HOURS AS NEEDED    . loratadine (CLARITIN) 10 MG tablet Take 10 mg by mouth daily.    . Metoprolol Succinate 25 MG CS24 Take 25 mg by mouth daily.    . rivaroxaban (XARELTO) 20 MG TABS tablet Take 20 mg by mouth daily with supper.     No current facility-administered medications for this visit.     Allergies:   Cephalexin; Other; and Penicillins    Social History:  The patient  reports that she has never smoked. She has never used smokeless tobacco. She reports that she does not drink alcohol or use drugs.   Family History:  The patient's family history includes CVA in her father; Diabetes in her mother; Heart attack in her mother; Heart disease in her father; Hypertension in her father; Thyroid disease in her mother.    ROS:  Please see the history  of present illness.   Otherwise, review of systems are positive for none.   All other systems are reviewed and negative.    PHYSICAL EXAM: VS:  BP 126/74   Pulse 84   Ht 5' 7.5" (1.715 m)   Wt 173 lb 8 oz (78.7 kg)   SpO2 97%   BMI 26.77 kg/m  , BMI Body mass index is 26.77 kg/m. Affect appropriate Healthy:  appears stated age 67: normal Neck supple with no adenopathy JVP normal no bruits no thyromegaly Lungs clear with no wheezing and good diaphragmatic motion Heart:  S1/S2 no murmur, no rub, gallop or click PMI normal Abdomen: benighn, BS positve, no tenderness, no AAA no bruit.  No HSM or HJR Distal pulses intact with no bruits No edema Neuro non-focal Bruising on right forearm and left shin  No muscular weakness    EKG:  06/30/17 afib rate 83 inferior ST changes  10/06/17 Afib rate 72 nonspecific ST changes    Recent Labs: No results found for requested  labs within last 8760 hours.    Lipid Panel No results found for: CHOL, TRIG, HDL, CHOLHDL, VLDL, LDLCALC, LDLDIRECT    Wt Readings from Last 3 Encounters:  10/06/17 173 lb 8 oz (78.7 kg)      Other studies Reviewed: Additional studies/ records that were reviewed today include: Notes from DR [facility] labs, ECG .    ASSESSMENT AND PLAN:  1.  Afib: Long discussion with patient about rate control and anticoagulation vs conversion strategy Given normal EF no valve Disease that is significant and being asymptomatic favor rate control and anticoagulation. She is currently on appropriate meds. She will  Look into cost of eliquis / pradaxa but if these are not affordable will need to be set up in coumadin clinic and have INR;s checked Offered to have her see Dr Bettina Gavia in [place] but she declined  2. Thyroid:  TSH normal continue synthroid replacement 3. HTN:  Well controlled.  Continue current medications and low sodium Dash type diet.   4. Depression: continue Celexa mood seems appropriate    Current medicines are reviewed at length with the patient today.  The patient does not have concerns regarding medicines.  The following changes have been made:  None   Labs/ tests ordered today include: none   Orders Placed This Encounter  Procedures  . EKG 12-Lead     Disposition:   FU with me in a year      Signed, Jenkins Rouge, MD  10/06/2017 4:42 PM    [facility] [address], [place], [place]  [address] Phone: [phone]; Fax: [phone]

## 2017-10-06 NOTE — Patient Instructions (Addendum)
Medication Instructions:  Your physician recommends that you continue on your current medications as directed. Please refer to the Current Medication list given to you today.  Labwork: NONE  Testing/Procedures: NONE  Follow-Up: Your physician wants you to follow-up in: 6 to 8 weeks Dr. Bettina Gavia in [place].    If you need a refill on your cardiac medications before your next appointment, please call your pharmacy.

## 2017-10-26 ENCOUNTER — Encounter: Payer: Self-pay | Admitting: Cardiovascular Disease

## 2017-11-16 ENCOUNTER — Ambulatory Visit: Payer: Medicare Other | Admitting: Cardiology

## 2017-12-01 DIAGNOSIS — Z23 Encounter for immunization: Secondary | ICD-10-CM | POA: Diagnosis not present

## 2017-12-01 DIAGNOSIS — E063 Autoimmune thyroiditis: Secondary | ICD-10-CM | POA: Diagnosis not present

## 2017-12-01 DIAGNOSIS — E785 Hyperlipidemia, unspecified: Secondary | ICD-10-CM | POA: Diagnosis not present

## 2017-12-01 DIAGNOSIS — I4891 Unspecified atrial fibrillation: Secondary | ICD-10-CM | POA: Diagnosis not present

## 2017-12-01 DIAGNOSIS — R7309 Other abnormal glucose: Secondary | ICD-10-CM | POA: Diagnosis not present

## 2018-01-25 DIAGNOSIS — Z961 Presence of intraocular lens: Secondary | ICD-10-CM | POA: Diagnosis not present

## 2018-02-02 DIAGNOSIS — B349 Viral infection, unspecified: Secondary | ICD-10-CM | POA: Diagnosis not present

## 2018-02-02 DIAGNOSIS — Z85820 Personal history of malignant melanoma of skin: Secondary | ICD-10-CM | POA: Diagnosis not present

## 2018-02-02 DIAGNOSIS — J029 Acute pharyngitis, unspecified: Secondary | ICD-10-CM | POA: Diagnosis not present

## 2018-02-02 DIAGNOSIS — J019 Acute sinusitis, unspecified: Secondary | ICD-10-CM | POA: Diagnosis not present

## 2018-02-02 DIAGNOSIS — J301 Allergic rhinitis due to pollen: Secondary | ICD-10-CM | POA: Diagnosis not present

## 2018-02-15 NOTE — Progress Notes (Signed)
Cardiology Office Note   Date:  02/17/2018   ID:  Andrea Harding, DOB 12/06/1950, MRN [id number]  PCP:  [name]., MD  Cardiologist:   Jenkins Rouge, MD   No chief complaint on file.     History of Present Illness: Andrea Harding is a 67 y.o. female f/u chronic afib.   TTE reviewed from 07/06/17 EF 55-60% mild AR trivial MR LA described as mildly dilated but LA ID AP only 31 mm    History of Afib and HTN on Toprol and Xarelto  Labs ok Cr .86 TSH .835 Hct 42.6 A1c 6.3   She is asymptomatic No chest pain , fatigue palpitations or dyspnea.      Past Medical History:  Diagnosis Date  . A-fib ([place])   . Acquired autoimmune hypothyroidism   . Anxiety   . Asthma   . Asthma with bronchitis and status asthmaticus   . Blood sugar increased   . BMI 26.0-26.9,adult   . Borderline hyperlipidemia   . Chronic reflux esophagitis   . Dark stools   . Depression   . Drug therapy   . Hypertension   . Hypothyroidism   . Leg swelling   . Neoplasm of uncertain behavior of skin   . Pemphigoid   . Vaginal dryness   . Vaginitis, atrophic   . Varicella vaccination   . Vitamin D deficiency     Past Surgical History:  Procedure Laterality Date  . ABDOMINAL HYSTERECTOMY  1982  . ANGLE FOOT Right    2009  . HERNIA REPAIR  1974  . SQUAMOUS CELL CARCINOMA EXCISION Right    07/24/15  . TONSILLECTOMY  1961     Current Outpatient Medications  Medication Sig Dispense Refill  . ALPRAZolam (XANAX) 0.25 MG tablet Take 0.25 mg by mouth 3 (three) times daily as needed for anxiety.    Marland Kitchen apixaban (ELIQUIS) 5 MG TABS tablet Take 5 mg by mouth 2 (two) times daily.    . budesonide-formoterol (SYMBICORT) 80-4.5 MCG/ACT inhaler Inhale 2 puffs into the lungs 2 (two) times daily.    . Cholecalciferol (VITAMIN D) 2000 units CAPS Take 2,000 Units by mouth daily.    . citalopram (CELEXA) 20 MG tablet Take 60 mg by mouth daily.    Marland Kitchen levothyroxine (SYNTHROID, LEVOTHROID) 175 MCG tablet TAKE  1/2 TABLET BY MOUTH DAILY    . loperamide (IMODIUM) 2 MG capsule 2 mg. TAKE 1-2 CAPSULE BY MOUTH EVERY FOUR HOURS AS NEEDED    . loratadine (CLARITIN) 10 MG tablet Take 10 mg by mouth daily.    . Metoprolol Succinate 25 MG CS24 Take 25 mg by mouth daily.     No current facility-administered medications for this visit.     Allergies:   Cephalexin; Other; and Penicillins    Social History:  The patient  reports that she has never smoked. She has never used smokeless tobacco. She reports that she does not drink alcohol or use drugs.   Family History:  The patient's family history includes CVA in her father; Diabetes in her mother; Heart attack in her mother; Heart disease in her father; Hypertension in her father; Thyroid disease in her mother.    ROS:  Please see the history of present illness.   Otherwise, review of systems are positive for none.   All other systems are reviewed and negative.    PHYSICAL EXAM: VS:  BP 126/84   Pulse (!) 52   Ht 5' 7.5" (  1.715 m)   Wt 170 lb 3.2 oz (77.2 kg)   SpO2 98%   BMI 26.26 kg/m  , BMI Body mass index is 26.26 kg/m. Affect appropriate Healthy:  appears stated age 18: normal Neck supple with no adenopathy JVP normal no bruits no thyromegaly Lungs clear with no wheezing and good diaphragmatic motion Heart:  S1/S2 no murmur, no rub, gallop or click PMI normal Abdomen: benighn, BS positve, no tenderness, no AAA no bruit.  No HSM or HJR Distal pulses intact with no bruits No edema Neuro non-focal Skin warm and dry No muscular weakness     EKG:  06/30/17 afib rate 83 inferior ST changes  10/06/17 Afib rate 72 nonspecific ST changes    Recent Labs: No results found for requested labs within last 8760 hours.    Lipid Panel No results found for: CHOL, TRIG, HDL, CHOLHDL, VLDL, LDLCALC, LDLDIRECT    Wt Readings from Last 3 Encounters:  02/17/18 170 lb 3.2 oz (77.2 kg)  10/06/17 173 lb 8 oz (78.7 kg)      Other studies  Reviewed: Additional studies/ records that were reviewed today include: Notes from DR [facility] labs, ECG .    ASSESSMENT AND PLAN:  1.  Afib: Previous discussion regarding  rate control and anticoagulation vs conversion strategy Given normal EF no valve Disease that is significant and being asymptomatic favor rate control and anticoagulation.  Continue Toprol and xarelto Will update echo in a year  2. Thyroid:  TSH normal continue synthroid replacement 3. HTN:  Well controlled.  Continue current medications and low sodium Dash type diet.   4. Depression: continue Celexa mood seems appropriate    Current medicines are reviewed at length with the patient today.  The patient does not have concerns regarding medicines.  The following changes have been made:  None   Labs/ tests ordered today include: none   Orders Placed This Encounter  Procedures  . ECHOCARDIOGRAM COMPLETE     Disposition:   FU with me in a year  With echo    Signed, Jenkins Rouge, MD  02/17/2018 4:10 PM    [facility] [place], [place], [place]  [address] Phone: [phone]; Fax: [phone]

## 2018-02-17 ENCOUNTER — Encounter: Payer: Self-pay | Admitting: Cardiovascular Disease

## 2018-02-17 ENCOUNTER — Encounter (INDEPENDENT_AMBULATORY_CARE_PROVIDER_SITE_OTHER): Payer: Self-pay

## 2018-02-17 ENCOUNTER — Ambulatory Visit (INDEPENDENT_AMBULATORY_CARE_PROVIDER_SITE_OTHER): Payer: Medicare Other | Admitting: Cardiovascular Disease

## 2018-02-17 VITALS — BP 126/84 | HR 52 | Ht 67.5 in | Wt 170.2 lb

## 2018-02-17 DIAGNOSIS — I10 Essential (primary) hypertension: Secondary | ICD-10-CM

## 2018-02-17 DIAGNOSIS — I4891 Unspecified atrial fibrillation: Secondary | ICD-10-CM | POA: Diagnosis not present

## 2018-02-17 NOTE — Patient Instructions (Addendum)
Medication Instructions:   If you need a refill on your cardiac medications before your next appointment, please call your pharmacy.   Lab work:  If you have labs (blood work) drawn today and your tests are completely normal, you will receive your results only by: . MyChart Message (if you have MyChart) OR . A paper copy in the mail If you have any lab test that is abnormal or we need to change your treatment, we will call you to review the results.  Testing/Procedures: Your physician has requested that you have an echocardiogram in 1 year. Echocardiography is a painless test that uses sound waves to create images of your heart. It provides your doctor with information about the size and shape of your heart and how well your heart's chambers and valves are working. This procedure takes approximately one hour. There are no restrictions for this procedure.  Follow-Up: At [facility], you and your health needs are our priority.  As part of our continuing mission to provide you with exceptional heart care, we have created designated Provider Care Teams.  These Care Teams include your primary Cardiologist (physician) and Advanced Practice Providers (APPs -  Physician Assistants and Nurse Practitioners) who all work together to provide you with the care you need, when you need it. You will need a follow up appointment in 12 months.  Please call our office 2 months in advance to schedule this appointment.  You may see Peter Nishan, MD or one of the following Advanced Practice Providers on your designated Care Team:   Lori Gerhardt, NP Laura Ingold, NP . Jill McDaniel, NP    

## 2018-02-21 DIAGNOSIS — D1801 Hemangioma of skin and subcutaneous tissue: Secondary | ICD-10-CM | POA: Diagnosis not present

## 2018-02-21 DIAGNOSIS — L570 Actinic keratosis: Secondary | ICD-10-CM | POA: Diagnosis not present

## 2018-02-21 DIAGNOSIS — Z85820 Personal history of malignant melanoma of skin: Secondary | ICD-10-CM | POA: Diagnosis not present

## 2018-02-21 DIAGNOSIS — D225 Melanocytic nevi of trunk: Secondary | ICD-10-CM | POA: Diagnosis not present

## 2018-02-28 DIAGNOSIS — J189 Pneumonia, unspecified organism: Secondary | ICD-10-CM | POA: Diagnosis not present

## 2018-02-28 DIAGNOSIS — J45902 Unspecified asthma with status asthmaticus: Secondary | ICD-10-CM | POA: Diagnosis not present

## 2018-02-28 DIAGNOSIS — Z6826 Body mass index (BMI) 26.0-26.9, adult: Secondary | ICD-10-CM | POA: Diagnosis not present

## 2018-05-09 DIAGNOSIS — Z79899 Other long term (current) drug therapy: Secondary | ICD-10-CM | POA: Diagnosis not present

## 2018-05-09 DIAGNOSIS — Z1331 Encounter for screening for depression: Secondary | ICD-10-CM | POA: Diagnosis not present

## 2018-05-09 DIAGNOSIS — E785 Hyperlipidemia, unspecified: Secondary | ICD-10-CM | POA: Diagnosis not present

## 2018-05-09 DIAGNOSIS — Z9181 History of falling: Secondary | ICD-10-CM | POA: Diagnosis not present

## 2018-05-09 DIAGNOSIS — R7309 Other abnormal glucose: Secondary | ICD-10-CM | POA: Diagnosis not present

## 2018-05-09 DIAGNOSIS — E063 Autoimmune thyroiditis: Secondary | ICD-10-CM | POA: Diagnosis not present

## 2018-07-29 DIAGNOSIS — Z139 Encounter for screening, unspecified: Secondary | ICD-10-CM | POA: Diagnosis not present

## 2018-07-29 DIAGNOSIS — E785 Hyperlipidemia, unspecified: Secondary | ICD-10-CM | POA: Diagnosis not present

## 2018-07-29 DIAGNOSIS — Z1331 Encounter for screening for depression: Secondary | ICD-10-CM | POA: Diagnosis not present

## 2018-07-29 DIAGNOSIS — Z0000 Encounter for general adult medical examination without abnormal findings: Secondary | ICD-10-CM | POA: Diagnosis not present

## 2018-08-09 DIAGNOSIS — E785 Hyperlipidemia, unspecified: Secondary | ICD-10-CM | POA: Diagnosis not present

## 2018-08-09 DIAGNOSIS — F419 Anxiety disorder, unspecified: Secondary | ICD-10-CM | POA: Diagnosis not present

## 2018-08-09 DIAGNOSIS — F329 Major depressive disorder, single episode, unspecified: Secondary | ICD-10-CM | POA: Diagnosis not present

## 2018-08-09 DIAGNOSIS — R7309 Other abnormal glucose: Secondary | ICD-10-CM | POA: Diagnosis not present

## 2018-08-09 DIAGNOSIS — E063 Autoimmune thyroiditis: Secondary | ICD-10-CM | POA: Diagnosis not present

## 2018-08-16 DIAGNOSIS — R791 Abnormal coagulation profile: Secondary | ICD-10-CM | POA: Diagnosis not present

## 2018-08-16 DIAGNOSIS — Z6825 Body mass index (BMI) 25.0-25.9, adult: Secondary | ICD-10-CM | POA: Diagnosis not present

## 2018-08-23 DIAGNOSIS — R791 Abnormal coagulation profile: Secondary | ICD-10-CM | POA: Diagnosis not present

## 2018-08-23 DIAGNOSIS — Z6825 Body mass index (BMI) 25.0-25.9, adult: Secondary | ICD-10-CM | POA: Diagnosis not present

## 2018-08-30 DIAGNOSIS — R791 Abnormal coagulation profile: Secondary | ICD-10-CM | POA: Diagnosis not present

## 2018-08-30 DIAGNOSIS — I4891 Unspecified atrial fibrillation: Secondary | ICD-10-CM | POA: Diagnosis not present

## 2018-08-30 DIAGNOSIS — Z6824 Body mass index (BMI) 24.0-24.9, adult: Secondary | ICD-10-CM | POA: Diagnosis not present

## 2018-09-06 DIAGNOSIS — Z6825 Body mass index (BMI) 25.0-25.9, adult: Secondary | ICD-10-CM | POA: Diagnosis not present

## 2018-09-06 DIAGNOSIS — I4891 Unspecified atrial fibrillation: Secondary | ICD-10-CM | POA: Diagnosis not present

## 2018-09-06 DIAGNOSIS — R791 Abnormal coagulation profile: Secondary | ICD-10-CM | POA: Diagnosis not present

## 2018-09-20 DIAGNOSIS — F419 Anxiety disorder, unspecified: Secondary | ICD-10-CM | POA: Diagnosis not present

## 2018-09-20 DIAGNOSIS — Z6825 Body mass index (BMI) 25.0-25.9, adult: Secondary | ICD-10-CM | POA: Diagnosis not present

## 2018-09-20 DIAGNOSIS — R791 Abnormal coagulation profile: Secondary | ICD-10-CM | POA: Diagnosis not present

## 2018-09-26 DIAGNOSIS — Z1231 Encounter for screening mammogram for malignant neoplasm of breast: Secondary | ICD-10-CM | POA: Diagnosis not present

## 2018-09-27 DIAGNOSIS — I4891 Unspecified atrial fibrillation: Secondary | ICD-10-CM | POA: Diagnosis not present

## 2018-09-27 DIAGNOSIS — R791 Abnormal coagulation profile: Secondary | ICD-10-CM | POA: Diagnosis not present

## 2018-09-27 DIAGNOSIS — Z6825 Body mass index (BMI) 25.0-25.9, adult: Secondary | ICD-10-CM | POA: Diagnosis not present

## 2018-10-04 DIAGNOSIS — Z6825 Body mass index (BMI) 25.0-25.9, adult: Secondary | ICD-10-CM | POA: Diagnosis not present

## 2018-10-04 DIAGNOSIS — I4891 Unspecified atrial fibrillation: Secondary | ICD-10-CM | POA: Diagnosis not present

## 2018-10-04 DIAGNOSIS — R791 Abnormal coagulation profile: Secondary | ICD-10-CM | POA: Diagnosis not present

## 2018-10-05 DIAGNOSIS — D485 Neoplasm of uncertain behavior of skin: Secondary | ICD-10-CM | POA: Diagnosis not present

## 2018-10-05 DIAGNOSIS — L821 Other seborrheic keratosis: Secondary | ICD-10-CM | POA: Diagnosis not present

## 2018-10-05 DIAGNOSIS — D1801 Hemangioma of skin and subcutaneous tissue: Secondary | ICD-10-CM | POA: Diagnosis not present

## 2018-10-05 DIAGNOSIS — D225 Melanocytic nevi of trunk: Secondary | ICD-10-CM | POA: Diagnosis not present

## 2018-10-11 DIAGNOSIS — Z7901 Long term (current) use of anticoagulants: Secondary | ICD-10-CM | POA: Diagnosis not present

## 2018-10-11 DIAGNOSIS — I4891 Unspecified atrial fibrillation: Secondary | ICD-10-CM | POA: Diagnosis not present

## 2018-10-13 DIAGNOSIS — L608 Other nail disorders: Secondary | ICD-10-CM | POA: Insufficient documentation

## 2018-10-25 DIAGNOSIS — Z7901 Long term (current) use of anticoagulants: Secondary | ICD-10-CM | POA: Diagnosis not present

## 2018-10-25 DIAGNOSIS — I4891 Unspecified atrial fibrillation: Secondary | ICD-10-CM | POA: Diagnosis not present

## 2018-10-25 DIAGNOSIS — W57XXXA Bitten or stung by nonvenomous insect and other nonvenomous arthropods, initial encounter: Secondary | ICD-10-CM | POA: Diagnosis not present

## 2018-10-25 DIAGNOSIS — E663 Overweight: Secondary | ICD-10-CM | POA: Diagnosis not present

## 2018-10-25 DIAGNOSIS — S80869A Insect bite (nonvenomous), unspecified lower leg, initial encounter: Secondary | ICD-10-CM | POA: Diagnosis not present

## 2018-10-26 DIAGNOSIS — Z85828 Personal history of other malignant neoplasm of skin: Secondary | ICD-10-CM | POA: Diagnosis not present

## 2018-10-28 DIAGNOSIS — C44719 Basal cell carcinoma of skin of left lower limb, including hip: Secondary | ICD-10-CM | POA: Diagnosis not present

## 2018-10-28 DIAGNOSIS — L821 Other seborrheic keratosis: Secondary | ICD-10-CM | POA: Diagnosis not present

## 2018-10-28 DIAGNOSIS — D225 Melanocytic nevi of trunk: Secondary | ICD-10-CM | POA: Diagnosis not present

## 2018-10-28 DIAGNOSIS — D485 Neoplasm of uncertain behavior of skin: Secondary | ICD-10-CM | POA: Diagnosis not present

## 2018-10-28 DIAGNOSIS — L218 Other seborrheic dermatitis: Secondary | ICD-10-CM | POA: Diagnosis not present

## 2018-10-28 DIAGNOSIS — L905 Scar conditions and fibrosis of skin: Secondary | ICD-10-CM | POA: Diagnosis not present

## 2018-10-28 DIAGNOSIS — Z85828 Personal history of other malignant neoplasm of skin: Secondary | ICD-10-CM | POA: Diagnosis not present

## 2018-11-01 DIAGNOSIS — H1131 Conjunctival hemorrhage, right eye: Secondary | ICD-10-CM | POA: Diagnosis not present

## 2018-11-01 DIAGNOSIS — I4891 Unspecified atrial fibrillation: Secondary | ICD-10-CM | POA: Diagnosis not present

## 2018-11-01 DIAGNOSIS — Z7901 Long term (current) use of anticoagulants: Secondary | ICD-10-CM | POA: Diagnosis not present

## 2018-11-01 DIAGNOSIS — R21 Rash and other nonspecific skin eruption: Secondary | ICD-10-CM | POA: Diagnosis not present

## 2018-12-06 DIAGNOSIS — E785 Hyperlipidemia, unspecified: Secondary | ICD-10-CM | POA: Diagnosis not present

## 2018-12-06 DIAGNOSIS — R7309 Other abnormal glucose: Secondary | ICD-10-CM | POA: Diagnosis not present

## 2018-12-06 DIAGNOSIS — E063 Autoimmune thyroiditis: Secondary | ICD-10-CM | POA: Diagnosis not present

## 2018-12-06 DIAGNOSIS — Z7901 Long term (current) use of anticoagulants: Secondary | ICD-10-CM | POA: Diagnosis not present

## 2018-12-06 DIAGNOSIS — I4891 Unspecified atrial fibrillation: Secondary | ICD-10-CM | POA: Diagnosis not present

## 2018-12-06 DIAGNOSIS — Z23 Encounter for immunization: Secondary | ICD-10-CM | POA: Diagnosis not present

## 2018-12-08 DIAGNOSIS — C44719 Basal cell carcinoma of skin of left lower limb, including hip: Secondary | ICD-10-CM | POA: Diagnosis not present

## 2019-01-03 DIAGNOSIS — I4891 Unspecified atrial fibrillation: Secondary | ICD-10-CM | POA: Diagnosis not present

## 2019-01-03 DIAGNOSIS — Z7901 Long term (current) use of anticoagulants: Secondary | ICD-10-CM | POA: Diagnosis not present

## 2019-02-07 ENCOUNTER — Encounter (HOSPITAL_COMMUNITY): Payer: Self-pay | Admitting: Cardiovascular Disease

## 2019-02-07 DIAGNOSIS — I4891 Unspecified atrial fibrillation: Secondary | ICD-10-CM | POA: Diagnosis not present

## 2019-02-07 DIAGNOSIS — Z7901 Long term (current) use of anticoagulants: Secondary | ICD-10-CM | POA: Diagnosis not present

## 2019-03-23 DIAGNOSIS — I4891 Unspecified atrial fibrillation: Secondary | ICD-10-CM | POA: Diagnosis not present

## 2019-03-23 DIAGNOSIS — S8012XA Contusion of left lower leg, initial encounter: Secondary | ICD-10-CM | POA: Diagnosis not present

## 2019-03-23 DIAGNOSIS — Z7901 Long term (current) use of anticoagulants: Secondary | ICD-10-CM | POA: Diagnosis not present

## 2019-04-26 NOTE — Progress Notes (Signed)
Cardiology Office Note   Date:  05/01/2019   ID:  Andrea Harding, DOB [birth year], MRN [id number]  PCP:  Penelope Coop, FNP  Cardiologist:   Jenkins Rouge, MD   No chief complaint on file.     History of Present Illness: Andrea Harding is a 69 y.o. female f/u chronic afib and HTN  TTE reviewed from 07/06/17 EF 55-60% mild AR trivial MR LA described as mildly dilated but LA ID AP only 31 mm    On Toprol and Xarelto  Labs ok Cr .86 TSH .835 Hct 42.6 A1c 6.3   She is asymptomatic No chest pain , fatigue palpitations or dyspnea.   Discussed updating echo to make sure EF still normal and not developing MR  Discussed getting vaccine at [facility] location She is getting f/u echo latter today for her afib   PA in [place] checks INR every 4 weeks no bleeding issues   Past Medical History:  Diagnosis Date  . A-fib ([place])   . Acquired autoimmune hypothyroidism   . Anxiety   . Asthma   . Asthma with bronchitis and status asthmaticus   . Blood sugar increased   . BMI 26.0-26.9,adult   . Borderline hyperlipidemia   . Chronic reflux esophagitis   . Dark stools   . Depression   . Drug therapy   . Hypertension   . Hypothyroidism   . Leg swelling   . Neoplasm of uncertain behavior of skin   . Pemphigoid   . Vaginal dryness   . Vaginitis, atrophic   . Varicella vaccination   . Vitamin D deficiency     Past Surgical History:  Procedure Laterality Date  . ABDOMINAL HYSTERECTOMY  1982  . ANGLE FOOT Right    2009  . HERNIA REPAIR  1974  . SQUAMOUS CELL CARCINOMA EXCISION Right    07/24/15  . TONSILLECTOMY  1961     Current Outpatient Medications  Medication Sig Dispense Refill  . ALPRAZolam (XANAX) 0.25 MG tablet Take 0.25 mg by mouth 3 (three) times daily as needed for anxiety.    . budesonide-formoterol (SYMBICORT) 80-4.5 MCG/ACT inhaler Inhale 2 puffs into the lungs 2 (two) times daily.    . Cholecalciferol (VITAMIN D) 2000 units CAPS Take 2,000 Units by  mouth daily.    . citalopram (CELEXA) 20 MG tablet Take 60 mg by mouth daily.    Marland Kitchen levothyroxine (SYNTHROID, LEVOTHROID) 175 MCG tablet TAKE 1/2 TABLET BY MOUTH DAILY    . loperamide (IMODIUM) 2 MG capsule 2 mg. TAKE 1-2 CAPSULE BY MOUTH EVERY FOUR HOURS AS NEEDED    . loratadine (CLARITIN) 10 MG tablet Take 10 mg by mouth daily.    . Metoprolol Succinate 25 MG CS24 Take 25 mg by mouth daily.    Marland Kitchen warfarin (COUMADIN) 5 MG tablet Take 1 tablet by mouth as directed. Take 1 tablet by mouth once a day except Fridays take 1.5 tablets     No current facility-administered medications for this visit.    Allergies:   Cephalexin, Other, and Penicillins    Social History:  The patient  reports that she has never smoked. She has never used smokeless tobacco. She reports that she does not drink alcohol or use drugs.   Family History:  The patient's family history includes CVA in her father; Diabetes in her mother; Heart attack in her mother; Heart disease in her father; Hypertension in her father; Thyroid disease in her mother.  ROS:  Please see the history of present illness.   Otherwise, review of systems are positive for none.   All other systems are reviewed and negative.    PHYSICAL EXAM: VS:  BP 116/76   Pulse 72   Ht 5' 7.5" (1.715 m)   Wt 175 lb (79.4 kg)   SpO2 99%   BMI 27.00 kg/m  , BMI Body mass index is 27 kg/m. Affect appropriate Healthy:  appears stated age 43: normal Neck supple with no adenopathy JVP normal no bruits no thyromegaly Lungs clear with no wheezing and good diaphragmatic motion Heart:  S1/S2 no murmur, no rub, gallop or click PMI normal Abdomen: benighn, BS positve, no tenderness, no AAA no bruit.  No HSM or HJR Distal pulses intact with no bruits No edema Neuro non-focal Skin warm and dry No muscular weakness   EKG:  06/30/17 afib rate 83 inferior ST changes  10/06/17 Afib rate 72 nonspecific ST changes 05/01/19 afib rate 72 nonspecific ST changes     Recent Labs: No results found for requested labs within last 8760 hours.    Lipid Panel No results found for: CHOL, TRIG, HDL, CHOLHDL, VLDL, LDLCALC, LDLDIRECT    Wt Readings from Last 3 Encounters:  05/01/19 175 lb (79.4 kg)  02/17/18 170 lb 3.2 oz (77.2 kg)  10/06/17 173 lb 8 oz (78.7 kg)     Other studies Reviewed: Additional studies/ records that were reviewed today include: Notes from DR [facility] labs, ECG .  ASSESSMENT AND PLAN:  1.  Afib: Previous discussion regarding  rate control and anticoagulation vs conversion strategy Given normal EF no valve Disease that is significant and being asymptomatic favor rate control and anticoagulation.  Continue Toprol and xarelto   2. Thyroid:  TSH normal continue synthroid replacement 3. HTN:  Well controlled.  Continue current medications and low sodium Dash type diet.   4. Depression: continue Celexa mood seems appropriate    Current medicines are reviewed at length with the patient today.  The patient does not have concerns regarding medicines.  The following changes have been made:  None   Labs/ tests ordered today include: Echo   No orders of the defined types were placed in this encounter.    Disposition:   FU with me in a year  Echo ordered to confirm normal EF and assess atrial sizes     Signed, Jenkins Rouge, MD  05/01/2019 2:30 PM    [facility] [place], [place], [place]  [address] Phone: [phone]; Fax: [phone]

## 2019-05-01 ENCOUNTER — Encounter (INDEPENDENT_AMBULATORY_CARE_PROVIDER_SITE_OTHER): Payer: Self-pay

## 2019-05-01 ENCOUNTER — Ambulatory Visit (INDEPENDENT_AMBULATORY_CARE_PROVIDER_SITE_OTHER): Payer: Medicare Other | Admitting: Cardiovascular Disease

## 2019-05-01 ENCOUNTER — Encounter: Payer: Self-pay | Admitting: Cardiovascular Disease

## 2019-05-01 ENCOUNTER — Other Ambulatory Visit: Payer: Self-pay

## 2019-05-01 ENCOUNTER — Ambulatory Visit (HOSPITAL_COMMUNITY): Payer: Medicare Other | Attending: Cardiology

## 2019-05-01 VITALS — BP 116/76 | HR 72 | Ht 67.5 in | Wt 175.0 lb

## 2019-05-01 DIAGNOSIS — J45909 Unspecified asthma, uncomplicated: Secondary | ICD-10-CM | POA: Diagnosis not present

## 2019-05-01 DIAGNOSIS — I351 Nonrheumatic aortic (valve) insufficiency: Secondary | ICD-10-CM | POA: Insufficient documentation

## 2019-05-01 DIAGNOSIS — I4891 Unspecified atrial fibrillation: Secondary | ICD-10-CM

## 2019-05-01 DIAGNOSIS — E039 Hypothyroidism, unspecified: Secondary | ICD-10-CM | POA: Diagnosis not present

## 2019-05-01 DIAGNOSIS — I10 Essential (primary) hypertension: Secondary | ICD-10-CM | POA: Insufficient documentation

## 2019-05-01 LAB — ECHOCARDIOGRAM COMPLETE
Height: 67.5 in
Weight: 2800 oz

## 2019-05-01 NOTE — Patient Instructions (Addendum)
Medication Instructions:  *If you need a refill on your cardiac medications before your next appointment, please call your pharmacy*  Lab Work: If you have labs (blood work) drawn today and your tests are completely normal, you will receive your results only by: Marland Kitchen MyChart Message (if you have MyChart) OR . A paper copy in the mail If you have any lab test that is abnormal or we need to change your treatment, we will call you to review the results.  Testing/Procedures: Your physician has requested that you have an echocardiogram today. Echocardiography is a painless test that uses sound waves to create images of your heart. It provides your doctor with information about the size and shape of your heart and how well your heart's chambers and valves are working. This procedure takes approximately one hour. There are no restrictions for this procedure.   Follow-Up: At [facility], you and your health needs are our priority.  As part of our continuing mission to provide you with exceptional heart care, we have created designated Provider Care Teams.  These Care Teams include your primary Cardiologist (physician) and Advanced Practice Providers (APPs -  Physician Assistants and Nurse Practitioners) who all work together to provide you with the care you need, when you need it.  We recommend signing up for the patient portal called "MyChart".  Sign up information is provided on this After Visit Summary.  MyChart is used to connect with patients for Virtual Visits (Telemedicine).  Patients are able to view lab/test results, encounter notes, upcoming appointments, etc.  Non-urgent messages can be sent to your provider as well.   To learn more about what you can do with MyChart, go to [email or web address].    Your next appointment:   12 month(s)  The format for your next appointment:   In Person  Provider:   You may see Jenkins Rouge, MD or one of the following Advanced Practice Providers on your  designated Care Team:    Truitt Merle, NP  Cecilie Kicks, NP  Kathyrn Drown, NP

## 2019-05-04 ENCOUNTER — Telehealth: Payer: Self-pay | Admitting: Cardiovascular Disease

## 2019-05-04 DIAGNOSIS — E785 Hyperlipidemia, unspecified: Secondary | ICD-10-CM | POA: Diagnosis not present

## 2019-05-04 DIAGNOSIS — Z7901 Long term (current) use of anticoagulants: Secondary | ICD-10-CM | POA: Diagnosis not present

## 2019-05-04 DIAGNOSIS — E063 Autoimmune thyroiditis: Secondary | ICD-10-CM | POA: Diagnosis not present

## 2019-05-04 DIAGNOSIS — R931 Abnormal findings on diagnostic imaging of heart and coronary circulation: Secondary | ICD-10-CM

## 2019-05-04 DIAGNOSIS — E559 Vitamin D deficiency, unspecified: Secondary | ICD-10-CM | POA: Diagnosis not present

## 2019-05-04 DIAGNOSIS — R7309 Other abnormal glucose: Secondary | ICD-10-CM | POA: Diagnosis not present

## 2019-05-04 DIAGNOSIS — I4891 Unspecified atrial fibrillation: Secondary | ICD-10-CM | POA: Diagnosis not present

## 2019-05-04 NOTE — Telephone Encounter (Signed)
Wife calling for echo results.

## 2019-05-05 DIAGNOSIS — Z85828 Personal history of other malignant neoplasm of skin: Secondary | ICD-10-CM | POA: Diagnosis not present

## 2019-05-05 DIAGNOSIS — D229 Melanocytic nevi, unspecified: Secondary | ICD-10-CM | POA: Diagnosis not present

## 2019-05-05 DIAGNOSIS — D692 Other nonthrombocytopenic purpura: Secondary | ICD-10-CM | POA: Diagnosis not present

## 2019-05-05 DIAGNOSIS — L905 Scar conditions and fibrosis of skin: Secondary | ICD-10-CM | POA: Diagnosis not present

## 2019-05-05 DIAGNOSIS — L820 Inflamed seborrheic keratosis: Secondary | ICD-10-CM | POA: Diagnosis not present

## 2019-05-05 MED ORDER — ENTRESTO 24-26 MG PO TABS: 1.0000 | ORAL_TABLET | Freq: Two times a day (BID) | ORAL | 11 refills | Status: DC

## 2019-05-05 NOTE — Telephone Encounter (Signed)
**Note De-Identified Andrea Harding Obfuscation** I called [facility] [employer] to get Ins info from the card they are running the pts Entresto under so I can attempt a PA or Tier Ex if they are needed.  I was advised by Jinny Blossom that they are unable to tell if a PA is required as they need an updated insurance card from the pt. Jinny Blossom states that they have left a VM message asking the pt to call them back.  I called the pt but got no answer so I left a message on the pts home phone VM asking him to provide new ins card to [facility] so we will know if a PA is required for his Entresto.  I did leave my name and the office phone number so he can call back if he has questions.

## 2019-05-05 NOTE — Telephone Encounter (Signed)
Per Dr. Johnsie Cancel "EF now mildly decreased 40-45% compared to 2019 start low dose entresto continue Toprol f/u pharm D 3 weeks with BMET to titrate f/u with me in 3 months"   Patient will come in on 05/26/19 to see Pharm D and get lab work done. Patient is concerned about being able to afford medication. Will give patient assistance paper work, 30 day free card and send message to [place] [employer] to help get patient's medication covered if patient qualifies.  Patient is schedule to see Dr. Johnsie Cancel in 3 months.

## 2019-05-22 ENCOUNTER — Telehealth: Payer: Self-pay | Admitting: Cardiovascular Disease

## 2019-05-22 DIAGNOSIS — I10 Essential (primary) hypertension: Secondary | ICD-10-CM

## 2019-05-22 DIAGNOSIS — I4891 Unspecified atrial fibrillation: Secondary | ICD-10-CM

## 2019-05-22 DIAGNOSIS — Z79899 Other long term (current) drug therapy: Secondary | ICD-10-CM

## 2019-05-22 DIAGNOSIS — R931 Abnormal findings on diagnostic imaging of heart and coronary circulation: Secondary | ICD-10-CM

## 2019-05-22 NOTE — Telephone Encounter (Signed)
We will watch he kidneys with therapy alternatives like ARB may also affect kidney and are not as good at helping heart

## 2019-05-22 NOTE — Telephone Encounter (Signed)
Patient states she is calling to further discuss echocardiogram results from echocardiogram completed on 05/01/19. Please return call to discuss.

## 2019-05-22 NOTE — Telephone Encounter (Signed)
Left message for patient to call back  

## 2019-05-22 NOTE — Telephone Encounter (Signed)
Called patient back about her message. Patient has not started the [facility]. Patient has picked up 30 day free supply of Entresto, but she is afraid to start it. Patient is concerned with having kidney issues with taking the medication. Patient is wondering if eating healthy and exercising will help her heart. Tired to explain to patient that eating a healthy diet and exercising will help her over all health, but she would most likely see an improvement with the medication along with exercise and eating a heart healthy diet.  Patient asked if she does not take Entresto, due to cost, what else could she take? Informed patient her message would be sent to Dr. Johnsie Cancel for advisement.  Patient was started on Entresto after her echo results from 05/01/19, " EF now mildly decreased 40-45% compared to 2019 start low dose entresto continue Toprol f/u pharm D 3 weeks with BMET to titrate f/u with me in 3 months "

## 2019-05-24 NOTE — Telephone Encounter (Signed)
Follow Up:     Pt says she does not want to take the [facility], because of the side effects.  She would like for Dr Johnsie Cancel to recommend something else please.i    Follow Upp:   Abby  from [employer] called and wants to  Know if pt can take Lisinopril and Metoprolol in place of the [facility]? If so, what ose does the pt need to take.?

## 2019-05-26 ENCOUNTER — Other Ambulatory Visit: Payer: Medicare Other

## 2019-05-26 ENCOUNTER — Ambulatory Visit: Payer: Medicare Other

## 2019-05-31 NOTE — Telephone Encounter (Signed)
Can start cozaar 25 mg daily and check BMET in 3 weeks

## 2019-05-31 NOTE — Telephone Encounter (Signed)
[place] to let them know that Dr. Johnsie Cancel had recommended an ABR for alternative for entresto. Informed them patient was already taking Metoprolol. They feel better if Dr. Johnsie Cancel prescribes, so will forward to Dr. Johnsie Cancel for dosage.

## 2019-06-01 MED ORDER — LOSARTAN POTASSIUM 25 MG PO TABS: 25.0000 mg | ORAL_TABLET | Freq: Every day | ORAL | 3 refills | Status: DC

## 2019-06-01 NOTE — Telephone Encounter (Signed)
Patient would like her lab checked at [facility]. Ordered placed.

## 2019-06-01 NOTE — Addendum Note (Signed)
Addended by: Aris Georgia, Sheilla Maris L on: 06/01/2019 05:21 PM   Modules accepted: Orders

## 2019-06-01 NOTE — Telephone Encounter (Signed)
Called patient with Dr. Kyla Balzarine advisement. Patient will start Losartan 25 mg daily and come in for lab work on 06/27/19.

## 2019-06-08 DIAGNOSIS — I4891 Unspecified atrial fibrillation: Secondary | ICD-10-CM | POA: Diagnosis not present

## 2019-06-08 DIAGNOSIS — Z7901 Long term (current) use of anticoagulants: Secondary | ICD-10-CM | POA: Diagnosis not present

## 2019-06-09 ENCOUNTER — Telehealth: Payer: Self-pay | Admitting: Cardiovascular Disease

## 2019-06-09 NOTE — Telephone Encounter (Signed)
   Pt calling, she said she spoke with Pam last week and was advised she can come in to [facility] in [place] on 06/27/19  Please clarify

## 2019-06-09 NOTE — Telephone Encounter (Signed)
Pt is scheduled at [facility] for labs on 4/27.  I tried to call her but was unable to reach her.  Work number could not be completed as dialed.  Home number is busy. Unable to leave a message.

## 2019-06-12 NOTE — Telephone Encounter (Signed)
lpmtcb 4/12 

## 2019-06-13 NOTE — Telephone Encounter (Signed)
Lpm about labs scheduled at [facility] 4/27.  I told her to call, if questions.

## 2019-06-19 ENCOUNTER — Telehealth: Payer: Self-pay | Admitting: Cardiovascular Disease

## 2019-06-19 NOTE — Telephone Encounter (Signed)
   Went to pt chart to check phone note 06/09/2019. Pt also verified appts.

## 2019-06-27 ENCOUNTER — Other Ambulatory Visit: Payer: Self-pay

## 2019-06-27 ENCOUNTER — Other Ambulatory Visit: Payer: Medicare Other

## 2019-06-27 DIAGNOSIS — I4891 Unspecified atrial fibrillation: Secondary | ICD-10-CM | POA: Diagnosis not present

## 2019-06-27 DIAGNOSIS — Z79899 Other long term (current) drug therapy: Secondary | ICD-10-CM | POA: Diagnosis not present

## 2019-06-27 DIAGNOSIS — I10 Essential (primary) hypertension: Secondary | ICD-10-CM | POA: Diagnosis not present

## 2019-06-27 LAB — BASIC METABOLIC PANEL
BUN/Creatinine Ratio: 33 — ABNORMAL HIGH (ref 12–28)
BUN: 26 mg/dL (ref 8–27)
CO2: 24 mmol/L (ref 20–29)
Calcium: 9 mg/dL (ref 8.7–10.3)
Chloride: 100 mmol/L (ref 96–106)
Creatinine, Ser: 0.78 mg/dL (ref 0.57–1.00)
GFR calc Af Amer: 90 mL/min/{1.73_m2} (ref >59)
GFR calc non Af Amer: 78 mL/min/{1.73_m2} (ref >59)
Glucose: 92 mg/dL (ref 65–99)
Potassium: 4.6 mmol/L (ref 3.5–5.2)
Sodium: 136 mmol/L (ref 134–144)

## 2019-07-06 ENCOUNTER — Telehealth: Payer: Self-pay | Admitting: Cardiovascular Disease

## 2019-07-06 NOTE — Telephone Encounter (Signed)
Follow Up:     Pt said she received her lab results in the mail. She said she needs somebody to explain it, she does not understand it.N

## 2019-07-07 NOTE — Telephone Encounter (Signed)
Left message for patient to call back  

## 2019-07-12 NOTE — Telephone Encounter (Signed)
Called patient, told her that lab work was okay. Patient got disconnected before ending the call. Tried to call patient back and just got voicemail. Will await for patient to call back.

## 2019-07-20 DIAGNOSIS — J029 Acute pharyngitis, unspecified: Secondary | ICD-10-CM | POA: Diagnosis not present

## 2019-07-20 DIAGNOSIS — J069 Acute upper respiratory infection, unspecified: Secondary | ICD-10-CM | POA: Diagnosis not present

## 2019-07-20 DIAGNOSIS — Z20822 Contact with and (suspected) exposure to covid-19: Secondary | ICD-10-CM | POA: Diagnosis not present

## 2019-07-20 DIAGNOSIS — B349 Viral infection, unspecified: Secondary | ICD-10-CM | POA: Diagnosis not present

## 2019-08-07 ENCOUNTER — Ambulatory Visit: Payer: Medicare Other | Admitting: Cardiovascular Disease

## 2019-08-14 DIAGNOSIS — F411 Generalized anxiety disorder: Secondary | ICD-10-CM | POA: Diagnosis not present

## 2019-08-14 DIAGNOSIS — E039 Hypothyroidism, unspecified: Secondary | ICD-10-CM | POA: Insufficient documentation

## 2019-08-14 DIAGNOSIS — I10 Essential (primary) hypertension: Secondary | ICD-10-CM | POA: Diagnosis not present

## 2019-08-14 DIAGNOSIS — I509 Heart failure, unspecified: Secondary | ICD-10-CM | POA: Insufficient documentation

## 2019-08-14 DIAGNOSIS — I480 Paroxysmal atrial fibrillation: Secondary | ICD-10-CM | POA: Diagnosis not present

## 2019-08-14 DIAGNOSIS — Z13220 Encounter for screening for lipoid disorders: Secondary | ICD-10-CM | POA: Diagnosis not present

## 2019-08-14 DIAGNOSIS — R7303 Prediabetes: Secondary | ICD-10-CM | POA: Insufficient documentation

## 2019-08-15 DIAGNOSIS — E782 Mixed hyperlipidemia: Secondary | ICD-10-CM | POA: Insufficient documentation

## 2019-08-28 DIAGNOSIS — J45909 Unspecified asthma, uncomplicated: Secondary | ICD-10-CM | POA: Insufficient documentation

## 2019-08-28 DIAGNOSIS — N952 Postmenopausal atrophic vaginitis: Secondary | ICD-10-CM | POA: Insufficient documentation

## 2019-08-28 DIAGNOSIS — E559 Vitamin D deficiency, unspecified: Secondary | ICD-10-CM | POA: Insufficient documentation

## 2019-09-07 DIAGNOSIS — L03116 Cellulitis of left lower limb: Secondary | ICD-10-CM | POA: Insufficient documentation

## 2019-09-13 DIAGNOSIS — L03116 Cellulitis of left lower limb: Secondary | ICD-10-CM | POA: Diagnosis not present

## 2019-09-13 DIAGNOSIS — I480 Paroxysmal atrial fibrillation: Secondary | ICD-10-CM | POA: Diagnosis not present

## 2019-09-15 ENCOUNTER — Encounter: Payer: Self-pay | Admitting: Orthopaedic Surgery

## 2019-09-15 ENCOUNTER — Ambulatory Visit (INDEPENDENT_AMBULATORY_CARE_PROVIDER_SITE_OTHER): Payer: Medicare Other | Admitting: Orthopaedic Surgery

## 2019-09-15 ENCOUNTER — Ambulatory Visit (INDEPENDENT_AMBULATORY_CARE_PROVIDER_SITE_OTHER): Payer: Medicare Other

## 2019-09-15 VITALS — BP 172/87 | HR 75

## 2019-09-15 DIAGNOSIS — M79672 Pain in left foot: Secondary | ICD-10-CM

## 2019-09-15 NOTE — Progress Notes (Signed)
Cardiology Office Note   Date:  09/22/2019   ID:  Andrea Harding, DOB [birth year], MRN [id number]  PCP:  Georganna Skeans, PA-C  Cardiologist:   Jenkins Rouge, MD   No chief complaint on file.     History of Present Illness: Andrea Harding is a 69 y.o. female f/u chronic afib and HTN  TTE reviewed from 07/06/17 EF 55-60% mild AR trivial MR LA described as mildly dilated but LA ID AP only 31 mm   TTE reviewed from 05/01/19 EF 40-45% global mild MR/AR LAE 41 mm  Wanted to start Entresto but patient preferred ARB    On Toprol and coumadin   Labs ok Cr .78 K 4.6 06/27/19   She is asymptomatic No chest pain , fatigue palpitations or dyspnea.    PA in [place] checks INR every 4 weeks no bleeding issues   She has had some pain in her LLE Rx for cellulitis but still pain full LE venous duplex negative Labs for gout negative.   Daughter / son in law died and they are trying to care for 4 of their young kids Neither she or husband will get vaccine He actually had COVID a few weeks ago  Discussed DOAC rather than coumadin but she indicated a doctor told her if she gets a brain bleed She will have more issues with DOAC than coumadin I told her she is more likely to get a brain bleed On coumadin in the first place   Past Medical History:  Diagnosis Date  . A-fib ([place])   . Acquired autoimmune hypothyroidism   . Anxiety   . Asthma   . Asthma with bronchitis and status asthmaticus   . Blood sugar increased   . BMI 26.0-26.9,adult   . Borderline hyperlipidemia   . Chronic reflux esophagitis   . Dark stools   . Depression   . Drug therapy   . Hypertension   . Hypothyroidism   . Leg swelling   . Neoplasm of uncertain behavior of skin   . Pemphigoid   . Vaginal dryness   . Vaginitis, atrophic   . Varicella vaccination   . Vitamin D deficiency     Past Surgical History:  Procedure Laterality Date  . ABDOMINAL HYSTERECTOMY  1982  . ANGLE FOOT Right    2009  . HERNIA  REPAIR  1974  . SQUAMOUS CELL CARCINOMA EXCISION Right    07/24/15  . TONSILLECTOMY  1961     Current Outpatient Medications  Medication Sig Dispense Refill  . budesonide-formoterol (SYMBICORT) 80-4.5 MCG/ACT inhaler Inhale 2 puffs into the lungs 2 (two) times daily.    . Cholecalciferol (VITAMIN D) 2000 units CAPS Take 2,000 Units by mouth daily.    . citalopram (CELEXA) 20 MG tablet Take 60 mg by mouth daily.    . clonazePAM (KLONOPIN) 0.5 MG tablet Take 0.25 mg by mouth 3 (three) times daily as needed.    Marland Kitchen levothyroxine (SYNTHROID, LEVOTHROID) 175 MCG tablet TAKE 1/2 TABLET BY MOUTH DAILY    . loperamide (IMODIUM) 2 MG capsule 2 mg. TAKE 1-2 CAPSULE BY MOUTH EVERY FOUR HOURS AS NEEDED    . loratadine (CLARITIN) 10 MG tablet Take 10 mg by mouth daily.    Marland Kitchen losartan (COZAAR) 25 MG tablet Take 1 tablet (25 mg total) by mouth daily. 90 tablet 3  . Metoprolol Succinate 25 MG CS24 Take 25 mg by mouth daily.    Marland Kitchen warfarin (COUMADIN) 5 MG tablet  Take 1 tablet by mouth as directed. Take 1 tablet by mouth once a day except Fridays take 1.5 tablets     No current facility-administered medications for this visit.    Allergies:   Cephalexin, Other, and Penicillins    Social History:  The patient  reports that she has never smoked. She has never used smokeless tobacco. She reports that she does not drink alcohol and does not use drugs.   Family History:  The patient's family history includes CVA in her father; Diabetes in her mother; Heart attack in her mother; Heart disease in her father; Hypertension in her father; Thyroid disease in her mother.    ROS:  Please see the history of present illness.   Otherwise, review of systems are positive for none.   All other systems are reviewed and negative.    PHYSICAL EXAM: VS:  BP 112/80   Pulse 88   Ht 5' 7.5" (1.715 m)   Wt 171 lb 6.4 oz (77.7 kg)   SpO2 99%   BMI 26.45 kg/m  , BMI Body mass index is 26.45 kg/m. Affect appropriate Healthy:   appears stated age 30: normal Neck supple with no adenopathy JVP normal no bruits no thyromegaly Lungs clear with no wheezing and good diaphragmatic motion Heart:  S1/S2 no murmur, no rub, gallop or click PMI normal Abdomen: benighn, BS positve, no tenderness, no AAA no bruit.  No HSM or HJR Distal pulses intact with no bruits No edema Neuro non-focal Skin warm and dry No muscular weakness   EKG:   05/01/19 afib rate 72 nonspecific ST changes    Recent Labs: 06/27/2019: BUN 26; Creatinine, Ser 0.78; Potassium 4.6; Sodium 136    Lipid Panel No results found for: CHOL, TRIG, HDL, CHOLHDL, VLDL, LDLCALC, LDLDIRECT    Wt Readings from Last 3 Encounters:  09/22/19 171 lb 6.4 oz (77.7 kg)  05/01/19 175 lb (79.4 kg)  02/17/18 170 lb 3.2 oz (77.2 kg)     Other studies Reviewed: Additional studies/ records that were reviewed today include: Notes from [facility] labs, ECG .Echo 05/01/19   ASSESSMENT AND PLAN:  1.  Afib: Previous discussion regarding  rate control and anticoagulation vs conversion strategy Being asymptomatic decided on rate control and anticoagulation Currently on coumadin Discussed DOAC and she prefers to stay on coumadin   2. Thyroid:  TSH normal continue synthroid replacement  3. HTN:  Well controlled.  Continue current medications and low sodium Dash type diet.    4. Depression: continue Celexa mood seems appropriate  5. Mild Systolic Dysfunction :  No signs / symptoms CHF continue toprol and losartan f/u echo March 2022 Had recommended Entresto but cost prohibitive    Current medicines are reviewed at length with the patient today.  The patient does not have concerns regarding medicines.  The following changes have been made:  None   Labs/ tests ordered today include: Echo March 2022 afib/low EF   No orders of the defined types were placed in this encounter.    Disposition:   FU with me in a year       Signed, Jenkins Rouge, MD  09/22/2019 4:07 PM     [facility] [address], [place], [place]  [address] Phone: [phone]; Fax: [phone]

## 2019-09-15 NOTE — Progress Notes (Signed)
Office Visit Note   Patient: Andrea Harding           Date of Birth: December 15, 1950           MRN: [id number] Visit Date: 09/15/2019              Requested by: Penelope Coop, [place] [address] [place],  [place] [address] PCP: Penelope Coop, FNP   Assessment & Plan: Visit Diagnoses:  1. Pain in left foot     Plan: Patient may have had forefoot contusion.  This may have occurred when she was with the large dog.  She can use elevation, Aspercreme.  She will need to avoid anti-inflammatories since she is on Coumadin.  She will return if she has persistent or increasing problems.  X-ray results were reviewed and discussed with patient.  Images were reviewed.  Follow-Up Instructions: No follow-ups on file.   Orders:  Orders Placed This Encounter  Procedures  . XR Foot Complete Left   No orders of the defined types were placed in this encounter.     Procedures: No procedures performed   Clinical Data: No additional findings.   Subjective: Chief Complaint  Patient presents with  . Left Foot - Pain    HPI 69 year old female seen with pain in her left foot dorsally with some erythema x1 week.  Patient states she has had some pain with weightbearing.  She been treated with antibiotics for cellulitis in her ankle.  She noticed some mild swelling and pain in the forefoot.  She has been around a larger dog not really sure if she had her foot stepped on does not recall any specific falls or injuries.  She is on chronic Coumadin.  Review of Systems posterior decreased bone density on vitamin D, calcium.  Chronic anticoagulation on warfarin.  Otherwise 14 point systems negative is obtains HPI.   Objective: Vital Signs: BP (!) 172/87 (BP Location: Left Arm, Patient Position: Sitting, Cuff Size: Normal)   Pulse 75   Physical Exam Constitutional:      Appearance: She is well-developed.  HENT:     Head: Normocephalic.     Right Ear: External ear normal.      Left Ear: External ear normal.  Eyes:     Pupils: Pupils are equal, round, and reactive to light.  Neck:     Thyroid: No thyromegaly.     Trachea: No tracheal deviation.  Cardiovascular:     Rate and Rhythm: Normal rate.  Pulmonary:     Effort: Pulmonary effort is normal.  Abdominal:     Palpations: Abdomen is soft.  Skin:    General: Skin is warm and dry.  Neurological:     Mental Status: She is alert and oriented to person, place, and time.  Psychiatric:        Behavior: Behavior normal.     Ortho Exam patient has trace forefoot swelling with some loss of prominence of the superficial veins.  Some tenderness over the forefoot second through fourth metatarsal shafts.  Negative forefoot compression test.  Normal sensation in the toes note good flexion-extension extensor brevis muscles are normal.  Normal subtalar motion normal ankle motion.  Patient has minimal darkening over the forefoot without definite definite ecchymosis.  Specialty Comments:  No specialty comments available.  Imaging: No results found.   PMFS History: There are no problems to display for this patient.  Past Medical History:  Diagnosis Date  .  A-fib ([place])   . Acquired autoimmune hypothyroidism   . Anxiety   . Asthma   . Asthma with bronchitis and status asthmaticus   . Blood sugar increased   . BMI 26.0-26.9,adult   . Borderline hyperlipidemia   . Chronic reflux esophagitis   . Dark stools   . Depression   . Drug therapy   . Hypertension   . Hypothyroidism   . Leg swelling   . Neoplasm of uncertain behavior of skin   . Pemphigoid   . Vaginal dryness   . Vaginitis, atrophic   . Varicella vaccination   . Vitamin D deficiency     Family History  Problem Relation Age of Onset  . Heart attack Mother   . Diabetes Mother   . Thyroid disease Mother   . Hypertension Father   . Heart disease Father   . CVA Father     Past Surgical History:  Procedure Laterality Date  . ABDOMINAL  HYSTERECTOMY  1982  . ANGLE FOOT Right    2009  . HERNIA REPAIR  1974  . SQUAMOUS CELL CARCINOMA EXCISION Right    07/24/15  . TONSILLECTOMY  1961   Social History   Occupational History  . Not on file  Tobacco Use  . Smoking status: Never Smoker  . Smokeless tobacco: Never Used  Vaping Use  . Vaping Use: Never used  Substance and Sexual Activity  . Alcohol use: Never  . Drug use: Never  . Sexual activity: Not on file    Comment: MARRIED

## 2019-09-19 DIAGNOSIS — R600 Localized edema: Secondary | ICD-10-CM | POA: Diagnosis not present

## 2019-09-19 DIAGNOSIS — M7122 Synovial cyst of popliteal space [Baker], left knee: Secondary | ICD-10-CM | POA: Diagnosis not present

## 2019-09-21 DIAGNOSIS — M79672 Pain in left foot: Secondary | ICD-10-CM | POA: Diagnosis not present

## 2019-09-21 DIAGNOSIS — I480 Paroxysmal atrial fibrillation: Secondary | ICD-10-CM | POA: Diagnosis not present

## 2019-09-21 DIAGNOSIS — Z5181 Encounter for therapeutic drug level monitoring: Secondary | ICD-10-CM | POA: Diagnosis not present

## 2019-09-21 DIAGNOSIS — M7989 Other specified soft tissue disorders: Secondary | ICD-10-CM | POA: Diagnosis not present

## 2019-09-22 ENCOUNTER — Ambulatory Visit (INDEPENDENT_AMBULATORY_CARE_PROVIDER_SITE_OTHER): Payer: Medicare Other | Admitting: Cardiovascular Disease

## 2019-09-22 ENCOUNTER — Encounter: Payer: Self-pay | Admitting: Cardiovascular Disease

## 2019-09-22 ENCOUNTER — Other Ambulatory Visit: Payer: Self-pay

## 2019-09-22 VITALS — BP 112/80 | HR 88 | Ht 67.5 in | Wt 171.4 lb

## 2019-09-22 DIAGNOSIS — I519 Heart disease, unspecified: Secondary | ICD-10-CM | POA: Diagnosis not present

## 2019-09-22 DIAGNOSIS — I4891 Unspecified atrial fibrillation: Secondary | ICD-10-CM | POA: Diagnosis not present

## 2019-09-22 NOTE — Patient Instructions (Addendum)
Medication Instructions:  *If you need a refill on your cardiac medications before your next appointment, please call your pharmacy*  Lab Work: If you have labs (blood work) drawn today and your tests are completely normal, you will receive your results only by: Marland Kitchen MyChart Message (if you have MyChart) OR . A paper copy in the mail If you have any lab test that is abnormal or we need to change your treatment, we will call you to review the results.  Testing/Procedures: Your physician has requested that you have an echocardiogram in March. Echocardiography is a painless test that uses sound waves to create images of your heart. It provides your doctor with information about the size and shape of your heart and how well your heart's chambers and valves are working. This procedure takes approximately one hour. There are no restrictions for this procedure.  Follow-Up: At [facility], you and your health needs are our priority.  As part of our continuing mission to provide you with exceptional heart care, we have created designated Provider Care Teams.  These Care Teams include your primary Cardiologist (physician) and Advanced Practice Providers (APPs -  Physician Assistants and Nurse Practitioners) who all work together to provide you with the care you need, when you need it.  We recommend signing up for the patient portal called "MyChart".  Sign up information is provided on this After Visit Summary.  MyChart is used to connect with patients for Virtual Visits (Telemedicine).  Patients are able to view lab/test results, encounter notes, upcoming appointments, etc.  Non-urgent messages can be sent to your provider as well.   To learn more about what you can do with MyChart, go to [email or web address].    Your next appointment:   March  The format for your next appointment:   In Person  Provider:   You may see Jenkins Rouge, MD or one of the following Advanced Practice Providers on your  designated Care Team:    Truitt Merle, NP  Cecilie Kicks, NP  Kathyrn Drown, NP

## 2019-10-02 ENCOUNTER — Ambulatory Visit (INDEPENDENT_AMBULATORY_CARE_PROVIDER_SITE_OTHER): Payer: Medicare Other | Admitting: Podiatry

## 2019-10-02 ENCOUNTER — Other Ambulatory Visit: Payer: Self-pay

## 2019-10-02 DIAGNOSIS — M19272 Secondary osteoarthritis, left ankle and foot: Secondary | ICD-10-CM

## 2019-10-02 NOTE — Patient Instructions (Signed)
Look for Voltaren gel (also known as diclofenac gel) over the counter. Apply to the painful areas of the foot 3-4 times daily

## 2019-10-02 NOTE — Progress Notes (Signed)
  Subjective:  Patient ID: Andrea Harding, female    DOB: [birth year],  MRN: [id number]  Chief Complaint  Patient presents with  . Foot Pain    Lt medial arch, and dorsal foot pian and swelling x 1 mo; 8/10 constatn pain -no injury -worse with bieng on feet or at PM -pt states she had cellulitis at Lt leg 1 mo ago, had abxs and it went away tx: epsom satl and asprecreme -pt stats they also checked for blood clots and gout and everythin was negative   . Bleeding/Bruising    bruising at Rt 2nd-4th MPJ -no injury/pain -pt states she bruises all the time due to coupmadine Tx: none    69 y.o. female presents with the above complaint. History confirmed with patient.   Objective:  Physical Exam: warm, good capillary refill, no trophic changes or ulcerative lesions, normal DP and PT pulses and normal sensory exam. Left Foot: She has mild pain on palpation over the midtarsal joint and tarsometatarsal joints, mild pain on palpation with range of motion of the forefoot.  No pain on palpation to the plantar heel or plantar fascia. Assessment:   1. Other secondary osteoarthritis of left foot      Plan:  Patient was evaluated and treated and all questions answered.  -Reviewed with her that most of her pain appears to be secondary to midtarsal and tarsometatarsal arthritis.  Advised to use Voltaren gel for symptomatic treatment.  She was tested for gout and reportedly blood work was negative.  No evidence of plantar fasciitis.  Advised her to wear stiff soled shoes.  I also discussed surgical treatment of midfoot arthritis, if it worsens she will consider this.  No follow-ups on file.

## 2019-10-03 DIAGNOSIS — B029 Zoster without complications: Secondary | ICD-10-CM | POA: Diagnosis not present

## 2019-10-06 ENCOUNTER — Ambulatory Visit: Payer: Medicare Other | Admitting: Sports Medicine

## 2019-10-06 DIAGNOSIS — L905 Scar conditions and fibrosis of skin: Secondary | ICD-10-CM | POA: Diagnosis not present

## 2019-10-06 DIAGNOSIS — D229 Melanocytic nevi, unspecified: Secondary | ICD-10-CM | POA: Diagnosis not present

## 2019-10-06 DIAGNOSIS — B850 Pediculosis due to Pediculus humanus capitis: Secondary | ICD-10-CM | POA: Diagnosis not present

## 2019-10-06 DIAGNOSIS — Z85828 Personal history of other malignant neoplasm of skin: Secondary | ICD-10-CM | POA: Diagnosis not present

## 2019-10-12 ENCOUNTER — Ambulatory Visit: Payer: Medicare Other | Admitting: Sports Medicine

## 2019-10-12 DIAGNOSIS — Z1231 Encounter for screening mammogram for malignant neoplasm of breast: Secondary | ICD-10-CM | POA: Diagnosis not present

## 2019-10-12 DIAGNOSIS — M85852 Other specified disorders of bone density and structure, left thigh: Secondary | ICD-10-CM | POA: Diagnosis not present

## 2019-10-13 ENCOUNTER — Other Ambulatory Visit: Payer: Self-pay | Admitting: Sports Medicine

## 2019-10-13 ENCOUNTER — Ambulatory Visit (INDEPENDENT_AMBULATORY_CARE_PROVIDER_SITE_OTHER): Payer: Medicare Other

## 2019-10-13 ENCOUNTER — Encounter: Payer: Self-pay | Admitting: Sports Medicine

## 2019-10-13 ENCOUNTER — Ambulatory Visit (INDEPENDENT_AMBULATORY_CARE_PROVIDER_SITE_OTHER): Payer: Medicare Other | Admitting: Sports Medicine

## 2019-10-13 ENCOUNTER — Other Ambulatory Visit: Payer: Self-pay

## 2019-10-13 DIAGNOSIS — M778 Other enthesopathies, not elsewhere classified: Secondary | ICD-10-CM | POA: Diagnosis not present

## 2019-10-13 DIAGNOSIS — M7742 Metatarsalgia, left foot: Secondary | ICD-10-CM

## 2019-10-13 DIAGNOSIS — M79672 Pain in left foot: Secondary | ICD-10-CM

## 2019-10-13 DIAGNOSIS — M8430XA Stress fracture, unspecified site, initial encounter for fracture: Secondary | ICD-10-CM

## 2019-10-13 DIAGNOSIS — M779 Enthesopathy, unspecified: Secondary | ICD-10-CM | POA: Diagnosis not present

## 2019-10-13 MED ORDER — PREDNISONE 10 MG (21) PO TBPK
ORAL_TABLET | ORAL | 0 refills | Status: DC
Start: 2019-10-13 — End: 2019-10-31

## 2019-10-13 NOTE — Progress Notes (Signed)
Subjective: Andrea Harding is a 69 y.o. female patient who presents to office for evaluation of left foot pain. Patient complains of progressive pain especially over the last month in the left foot that seems like it is radiating more to the toes 5 out of 10 constant in nature with swelling reports that after last visit she did not like the care she received because the x-rays was down and she was told that she could have arthritis and was told to use a topical cream which she did not try it.  Patient reports that she has been doing a lot lately helping to care for her grandchildren and having to be on her feet does not recall any specific injury or trauma but is concerned because she is having increasing in pain and symptoms.  No other pedal complaints noted.  Patient Active Problem List   Diagnosis Date Noted   Cellulitis of left lower extremity 09/07/2019   Asthma 08/28/2019   Atrophic vaginitis 08/28/2019   Vitamin D deficiency 08/28/2019   Mixed hyperlipidemia 08/15/2019   Acquired hypothyroidism 08/14/2019   Essential hypertension 08/14/2019   Generalized anxiety disorder 08/14/2019   Heart failure ([place]) 08/14/2019   Paroxysmal A-fib ([place]) 08/14/2019   Prediabetes 08/14/2019   Acquired dysmorphic toenail 10/13/2018    Current Outpatient Medications on File Prior to Visit  Medication Sig Dispense Refill   Cholecalciferol (VITAMIN D) 2000 units CAPS Take 2,000 Units by mouth daily.     citalopram (CELEXA) 20 MG tablet Take 60 mg by mouth daily.     clonazePAM (KLONOPIN) 0.5 MG tablet Take 0.25 mg by mouth 3 (three) times daily as needed.     ivermectin (STROMECTOL) 3 MG TABS tablet Take by mouth.     levothyroxine (SYNTHROID, LEVOTHROID) 175 MCG tablet TAKE 1/2 TABLET BY MOUTH DAILY     losartan (COZAAR) 25 MG tablet Take 1 tablet (25 mg total) by mouth daily. 90 tablet 3   Metoprolol Succinate 25 MG CS24 Take 25 mg by mouth daily.     permethrin (ELIMITE) 5 %  cream Apply topically.     valACYclovir (VALTREX) 1000 MG tablet Take 1,000 mg by mouth 3 (three) times daily.     warfarin (COUMADIN) 5 MG tablet Take 1 tablet by mouth as directed. Take 1 tablet by mouth once a day except Fridays take 1.5 tablets     No current facility-administered medications on file prior to visit.    Allergies  Allergen Reactions   Cephalexin    Other     SULFA DRUGS   Penicillins    Sulfa Antibiotics Other (See Comments)    ask    Objective:  General: Alert and oriented x3 in no acute distress  Dermatology: No open lesions bilateral lower extremities, no webspace macerations, no ecchymosis bilateral, all nails x 10 are well manicured.  Vascular: Dorsalis Pedis and Posterior Tibial pedal faintly pulses palpable, Capillary Fill Time 5 seconds, minimal pedal hair growth bilateral, mild focal forefoot edema on left, there is varicosities present bilateral, temperature gradient within normal limits.  Neurology: Johney Maine sensation intact via light touch bilateral.  Musculoskeletal: Mild tenderness with palpation at all metatarsal heads on the left foot and with flexion and extension of toes.  There is mild focal swelling to the left forefoot.  Ankle joint range of motion within normal limits.  Strength within normal limits.  Gait: Antalgic gait  Xrays  Left foot   Impression: Mild midfoot joint space narrowing consistent with  arthritis there is questionable involvement at the second metatarsal base early stress reaction to the bone with increased ossification.  Mild soft tissue swelling.  Assessment and Plan: Problem List Items Addressed This Visit    None    Visit Diagnoses    Capsulitis of foot, left    -  Primary   Metatarsalgia of left foot       Stress reaction of bone       Left foot pain          -Complete examination performed -Xrays reviewed -Discussed treatement options for likely metatarsalgia versus stress reaction -Dispense offloading  metatarsal pad sleeve for compression and offloading of metatarsals to prevent additional stress advised patient if the area is still painful at next visit we will repeat another x-ray to further evaluate the metatarsals again -Meanwhile for pain since patient has significant cardiac issues and is on blood thinners we will prescribe prednisone for patient to take as instructed -Advised patient rest ice elevation and may use topical over-the-counter pain cream or rub as needed -Advised patient to wear good supportive shoes and refrain from walking barefoot -Patient to return to office in 2 to 3 weeks or sooner if condition worsens.  Landis Martins, DPM

## 2019-10-30 ENCOUNTER — Other Ambulatory Visit: Payer: Self-pay | Admitting: Sports Medicine

## 2019-10-30 DIAGNOSIS — M779 Enthesopathy, unspecified: Secondary | ICD-10-CM

## 2019-10-31 ENCOUNTER — Other Ambulatory Visit: Payer: Self-pay | Admitting: Sports Medicine

## 2019-10-31 ENCOUNTER — Ambulatory Visit: Payer: Medicare Other | Admitting: Sports Medicine

## 2019-10-31 DIAGNOSIS — M79672 Pain in left foot: Secondary | ICD-10-CM

## 2019-10-31 NOTE — Telephone Encounter (Signed)
Please Advise

## 2019-11-27 DIAGNOSIS — I480 Paroxysmal atrial fibrillation: Secondary | ICD-10-CM | POA: Diagnosis not present

## 2019-11-27 DIAGNOSIS — Z23 Encounter for immunization: Secondary | ICD-10-CM | POA: Diagnosis not present

## 2019-12-20 DIAGNOSIS — J4520 Mild intermittent asthma, uncomplicated: Secondary | ICD-10-CM | POA: Diagnosis not present

## 2019-12-20 DIAGNOSIS — I480 Paroxysmal atrial fibrillation: Secondary | ICD-10-CM | POA: Diagnosis not present

## 2020-02-09 DIAGNOSIS — I480 Paroxysmal atrial fibrillation: Secondary | ICD-10-CM | POA: Diagnosis not present

## 2020-02-22 DIAGNOSIS — R7303 Prediabetes: Secondary | ICD-10-CM | POA: Diagnosis not present

## 2020-02-22 DIAGNOSIS — E039 Hypothyroidism, unspecified: Secondary | ICD-10-CM | POA: Diagnosis not present

## 2020-02-22 DIAGNOSIS — N952 Postmenopausal atrophic vaginitis: Secondary | ICD-10-CM | POA: Diagnosis not present

## 2020-02-22 DIAGNOSIS — E782 Mixed hyperlipidemia: Secondary | ICD-10-CM | POA: Diagnosis not present

## 2020-02-22 DIAGNOSIS — I480 Paroxysmal atrial fibrillation: Secondary | ICD-10-CM | POA: Diagnosis not present

## 2020-02-22 DIAGNOSIS — I10 Essential (primary) hypertension: Secondary | ICD-10-CM | POA: Diagnosis not present

## 2020-02-22 DIAGNOSIS — F411 Generalized anxiety disorder: Secondary | ICD-10-CM | POA: Diagnosis not present

## 2020-03-28 DIAGNOSIS — I480 Paroxysmal atrial fibrillation: Secondary | ICD-10-CM | POA: Diagnosis not present

## 2020-04-08 DIAGNOSIS — D485 Neoplasm of uncertain behavior of skin: Secondary | ICD-10-CM | POA: Diagnosis not present

## 2020-04-08 DIAGNOSIS — L814 Other melanin hyperpigmentation: Secondary | ICD-10-CM | POA: Diagnosis not present

## 2020-04-08 DIAGNOSIS — D229 Melanocytic nevi, unspecified: Secondary | ICD-10-CM | POA: Diagnosis not present

## 2020-04-08 DIAGNOSIS — L989 Disorder of the skin and subcutaneous tissue, unspecified: Secondary | ICD-10-CM | POA: Diagnosis not present

## 2020-04-11 DIAGNOSIS — I480 Paroxysmal atrial fibrillation: Secondary | ICD-10-CM | POA: Diagnosis not present

## 2020-04-18 ENCOUNTER — Other Ambulatory Visit: Payer: Self-pay | Admitting: Cardiovascular Disease

## 2020-05-02 ENCOUNTER — Ambulatory Visit (HOSPITAL_COMMUNITY): Payer: Medicare Other | Attending: Cardiology

## 2020-05-02 ENCOUNTER — Other Ambulatory Visit: Payer: Self-pay

## 2020-05-02 DIAGNOSIS — I519 Heart disease, unspecified: Secondary | ICD-10-CM | POA: Diagnosis not present

## 2020-05-02 DIAGNOSIS — I4891 Unspecified atrial fibrillation: Secondary | ICD-10-CM | POA: Diagnosis not present

## 2020-05-02 LAB — ECHOCARDIOGRAM COMPLETE
Area-P 1/2: 2.73 cm2
P 1/2 time: 652 msec
S' Lateral: 3.3 cm

## 2020-05-07 ENCOUNTER — Ambulatory Visit: Payer: Medicare Other | Admitting: Sports Medicine

## 2020-05-13 DIAGNOSIS — I480 Paroxysmal atrial fibrillation: Secondary | ICD-10-CM | POA: Diagnosis not present

## 2020-06-03 DIAGNOSIS — J45909 Unspecified asthma, uncomplicated: Secondary | ICD-10-CM | POA: Diagnosis not present

## 2020-06-03 DIAGNOSIS — J209 Acute bronchitis, unspecified: Secondary | ICD-10-CM | POA: Diagnosis not present

## 2020-06-11 DIAGNOSIS — I480 Paroxysmal atrial fibrillation: Secondary | ICD-10-CM | POA: Diagnosis not present

## 2020-06-18 DIAGNOSIS — J209 Acute bronchitis, unspecified: Secondary | ICD-10-CM | POA: Diagnosis not present

## 2020-06-18 DIAGNOSIS — I480 Paroxysmal atrial fibrillation: Secondary | ICD-10-CM | POA: Diagnosis not present

## 2020-06-18 DIAGNOSIS — Z7901 Long term (current) use of anticoagulants: Secondary | ICD-10-CM | POA: Diagnosis not present

## 2020-06-18 DIAGNOSIS — R059 Cough, unspecified: Secondary | ICD-10-CM | POA: Diagnosis not present

## 2020-06-18 DIAGNOSIS — J4520 Mild intermittent asthma, uncomplicated: Secondary | ICD-10-CM | POA: Diagnosis not present

## 2020-07-05 DIAGNOSIS — F331 Major depressive disorder, recurrent, moderate: Secondary | ICD-10-CM | POA: Diagnosis not present

## 2020-07-05 DIAGNOSIS — J4520 Mild intermittent asthma, uncomplicated: Secondary | ICD-10-CM | POA: Diagnosis not present

## 2020-07-18 ENCOUNTER — Other Ambulatory Visit: Payer: Self-pay

## 2020-07-18 ENCOUNTER — Encounter: Payer: Self-pay | Admitting: Allergy and Immunology

## 2020-07-18 ENCOUNTER — Ambulatory Visit (INDEPENDENT_AMBULATORY_CARE_PROVIDER_SITE_OTHER): Payer: Medicare Other | Admitting: Allergy and Immunology

## 2020-07-18 ENCOUNTER — Ambulatory Visit: Payer: Medicare Other | Admitting: Allergy and Immunology

## 2020-07-18 VITALS — BP 148/94 | HR 76 | Resp 18 | Ht 66.0 in | Wt 177.4 lb

## 2020-07-18 DIAGNOSIS — J4550 Severe persistent asthma, uncomplicated: Secondary | ICD-10-CM | POA: Diagnosis not present

## 2020-07-18 DIAGNOSIS — J3089 Other allergic rhinitis: Secondary | ICD-10-CM

## 2020-07-18 DIAGNOSIS — K219 Gastro-esophageal reflux disease without esophagitis: Secondary | ICD-10-CM | POA: Diagnosis not present

## 2020-07-18 MED ORDER — FAMOTIDINE 40 MG PO TABS: 40.0000 mg | ORAL_TABLET | Freq: Every day | ORAL | 5 refills | Status: DC

## 2020-07-18 MED ORDER — BREZTRI AEROSPHERE 160-9-4.8 MCG/ACT IN AERO: INHALATION_SPRAY | RESPIRATORY_TRACT | 5 refills | Status: DC

## 2020-07-18 MED ORDER — OMEPRAZOLE 40 MG PO CPDR: DELAYED_RELEASE_CAPSULE | ORAL | 5 refills | Status: DC

## 2020-07-18 NOTE — Patient Instructions (Addendum)
  1.  Treat and prevent inflammation:   A. Breztri - 2 inhalations 2 times per day (empty lungs)  B. Montelukast 10 mg - 1 tablet 1 time per day  C. Prednisone 10 mg - 1 tablet 1 time per day for 10 days only  2.  Treat and prevent reflux/LPR:   A. Omeprazole 40 mg - 1 tablet 2 times per day  B. Famotidine 40 mg - 1 tablet in evening  3.  If needed:   A. Cetirizine 10 mg - 1 tablet 1-2 times per day  B. Mucinex DM - 1-2 tablets 1-2 times per day  C. Albuterol HFA - 2 inhalations every 4-6 hours  4.  Further evaluation and treatment?  5.  Return to clinic in 3 weeks or earlier if problem

## 2020-07-18 NOTE — Progress Notes (Signed)
[place] - [place] - [place] - [place] - [place]   Dear Dr. Oleta Mouse,  Thank you for referring Andrea Harding to the [facility] on 07/18/2020.   Below is a summation of this patient's evaluation and recommendations.  Thank you for your referral. I will keep you informed about this patient's response to treatment.   If you have any questions please do not hesitate to contact me.   Sincerely,  Jiles Prows, MD Allergy / Immunology [place]   ______________________________________________________________________    NEW PATIENT NOTE  Referring Provider: Georganna Skeans, PA-C Primary Provider: Nolen Mu Date of office visit: 07/18/2020    Subjective:   Chief Complaint:  Andrea Harding (DOB: 08-01-1950) is a 70 y.o. female who presents to the clinic on 07/18/2020 with a chief complaint of Breathing Problem .     HPI: Andrea Harding returns to this clinic in evaluation of respiratory tract problems.  I had apparently seen her in this clinic almost 20 years ago for an issue with asthma and LPR and possible vocal cord dysfunction.  She states that she was doing very well without any medications revolving around the care of her respiratory track or her reflux.  However, over the course of the past 6 to 8 weeks she has had lots of problems with unrelenting coughing and postnasal drip and "crud" in her throat and choking and gagging with her cough.  It has been disturbing her sleep.  She really has no associated upper airway symptoms.  She does not have any chest pain or sputum production or other respiratory tract symptoms.  Apparently she has been treated with prednisone and azithromycin at the beginning of this ordeal.  She has been given an inhaler which has not helped her.  She has been given doxycycline which has not helped her.  She has been given montelukast which she is not sure has  helped her.  Past Medical History:  Diagnosis Date  . A-fib ([place])   . Acquired autoimmune hypothyroidism   . Anxiety   . Asthma   . Asthma with bronchitis and status asthmaticus   . Blood sugar increased   . BMI 26.0-26.9,adult   . Borderline hyperlipidemia   . Chronic reflux esophagitis   . Dark stools   . Depression   . Drug therapy   . Hypertension   . Hypothyroidism   . Leg swelling   . Neoplasm of uncertain behavior of skin   . Pemphigoid   . Skin cancer   . Vaginal dryness   . Vaginitis, atrophic   . Varicella vaccination   . Vitamin D deficiency     Past Surgical History:  Procedure Laterality Date  . ABDOMINAL HYSTERECTOMY  1982  . ANGLE FOOT Right    2009  . CESAREAN SECTION    . HERNIA REPAIR  1974  . SQUAMOUS CELL CARCINOMA EXCISION Right    07/24/15  . TONSILLECTOMY  1961    Allergies as of 07/18/2020      Reactions   Amoxicillin Other (See Comments)   Muscle pain   Cephalexin    Other    SULFA DRUGS   Penicillins    Sulfa Antibiotics Swelling, Other (See Comments)   ask      Medication List    clonazePAM 0.5 MG tablet Commonly known as: KLONOPIN Take 0.25 mg by mouth 3 (three) times daily as  needed.   levothyroxine 175 MCG tablet Commonly known as: SYNTHROID TAKE 1/2 TABLET BY MOUTH DAILY   losartan 50 MG tablet Commonly known as: COZAAR Take 0.5 tablets by mouth daily.   metoprolol succinate 25 MG 24 hr tablet Commonly known as: TOPROL-XL Take 1 tablet by mouth daily.   montelukast 10 MG tablet Commonly known as: SINGULAIR Take 1 tablet by mouth at bedtime.   venlafaxine XR 37.5 MG 24 hr capsule Commonly known as: EFFEXOR-XR Take 37.5 mg by mouth daily.   Ventolin HFA 108 (90 Base) MCG/ACT inhaler Generic drug: albuterol Inhale 2 puffs into the lungs every 4 (four) hours as needed.   warfarin 5 MG tablet Commonly known as: COUMADIN Take 1 tablet by mouth as directed. Take 1 tablet by mouth once a day except Fridays take  1.5 tablets       Review of systems negative except as noted in HPI / PMHx or noted below:  Review of Systems  Constitutional: Negative.   HENT: Negative.   Eyes: Negative.   Respiratory: Negative.   Cardiovascular: Negative.   Gastrointestinal: Negative.   Genitourinary: Negative.   Musculoskeletal: Negative.   Skin: Negative.   Neurological: Negative.   Endo/Heme/Allergies: Negative.   Psychiatric/Behavioral: Negative.     Family History  Problem Relation Age of Onset  . Heart attack Mother   . Diabetes Mother   . Thyroid disease Mother   . Hypertension Father   . Heart disease Father   . CVA Father     Social History   Socioeconomic History  . Marital status: Married    Spouse name: Not on file  . Number of children: Not on file  . Years of education: Not on file  . Highest education level: Not on file  Occupational History  . Not on file  Tobacco Use  . Smoking status: Never Smoker  . Smokeless tobacco: Never Used  Vaping Use  . Vaping Use: Never used  Substance and Sexual Activity  . Alcohol use: Never  . Drug use: Never  . Sexual activity: Not on file    Comment: MARRIED  Other Topics Concern  . Not on file  Social History Narrative  . Not on file   Objective:   Vitals:   07/18/20 1205  BP: (!) 148/94  Pulse: 76  Resp: 18  SpO2: 98%   Height: 5\' 6"  (167.6 cm) Weight: 177 lb 6.4 oz (80.5 kg)  Physical Exam Constitutional:      Appearance: She is not diaphoretic.     Comments: Very raspy low-volume voice with throat clearing and slight cough  HENT:     Head: Normocephalic.     Right Ear: Tympanic membrane, ear canal and external ear normal.     Left Ear: Tympanic membrane, ear canal and external ear normal.     Nose: Nose normal. No mucosal edema or rhinorrhea.     Mouth/Throat:     Pharynx: Uvula midline. No oropharyngeal exudate.  Eyes:     Conjunctiva/sclera: Conjunctivae normal.  Neck:     Thyroid: No thyromegaly.      Trachea: Trachea normal. No tracheal tenderness or tracheal deviation.  Cardiovascular:     Rate and Rhythm: Normal rate and regular rhythm.     Heart sounds: Normal heart sounds, S1 normal and S2 normal. No murmur heard.   Pulmonary:     Effort: No respiratory distress.     Breath sounds: Normal breath sounds. No stridor. No wheezing or rales.  Lymphadenopathy:     Head:     Right side of head: No tonsillar adenopathy.     Left side of head: No tonsillar adenopathy.     Cervical: No cervical adenopathy.  Skin:    Findings: No erythema or rash.     Nails: There is no clubbing.  Neurological:     Mental Status: She is alert.     Diagnostics: Allergy skin tests were not performed.   Spirometry was performed and demonstrated an FEV1 of 1.18 @ 50 % of predicted. FEV1/FVC = 0.60  Results of blood tests obtained 21 September 2019 identifies WBC 6.8, absolute eosinophil 400, absolute lymphocyte 2500, hemoglobin 13.7, platelet 173  Results of a chest x-ray obtained 18 June 2020 identifies the following:  The heart size and mediastinal contours are within normal limits.  Normal pulmonary vascularity. Unchanged mild right apical  pleuroparenchymal scarring. No focal consolidation, pleural  effusion, or pneumothorax. No acute osseous abnormality.    Assessment and Plan:    1. Not well controlled severe persistent asthma   2. Perennial allergic rhinitis   3. LPRD (laryngopharyngeal reflux disease)     1.  Treat and prevent inflammation:   A. Breztri - 2 inhalations 2 times per day (empty lungs)  B. Montelukast 10 mg - 1 tablet 1 time per day  C. Prednisone 10 mg - 1 tablet 1 time per day for 10 days only  2.  Treat and prevent reflux/LPR:   A. Omeprazole 40 mg - 1 tablet 2 times per day  B. Famotidine 40 mg - 1 tablet in evening  3.  If needed:   A. Cetirizine 10 mg - 1 tablet 1-2 times per day  B. Mucinex DM - 1-2 tablets 1-2 times per day  C. Albuterol HFA - 2 inhalations  every 4-6 hours  4.  Further evaluation and treatment?  5.  Return to clinic in 3 weeks or earlier if problem  Although Andrea Harding's recent ordeal may have started with a viral respiratory tract infection I think that she has developed significant inflammation of her airway and significant irritation of her laryngeal structures for which we will have her utilize a combination of therapy directed against both inflammation and reflux induced respiratory disease as noted above prior to having her undergo any further evaluation or treatment for other etiologic factors that may be contributing to her respiratory tract symptoms.  I will see her back in this clinic in 3 weeks to assess her response to this plan.  Jiles Prows, MD Allergy / Immunology [facility]

## 2020-07-22 ENCOUNTER — Encounter: Payer: Self-pay | Admitting: Allergy and Immunology

## 2020-07-24 DIAGNOSIS — I480 Paroxysmal atrial fibrillation: Secondary | ICD-10-CM | POA: Diagnosis not present

## 2020-08-07 ENCOUNTER — Ambulatory Visit: Payer: Medicare Other | Admitting: Allergy and Immunology

## 2020-08-07 DIAGNOSIS — I480 Paroxysmal atrial fibrillation: Secondary | ICD-10-CM | POA: Diagnosis not present

## 2020-08-07 DIAGNOSIS — F331 Major depressive disorder, recurrent, moderate: Secondary | ICD-10-CM | POA: Diagnosis not present

## 2020-08-21 ENCOUNTER — Ambulatory Visit: Payer: Medicare Other | Admitting: Allergy and Immunology

## 2020-08-28 DIAGNOSIS — Z23 Encounter for immunization: Secondary | ICD-10-CM | POA: Diagnosis not present

## 2020-08-28 DIAGNOSIS — Z0000 Encounter for general adult medical examination without abnormal findings: Secondary | ICD-10-CM | POA: Diagnosis not present

## 2020-08-28 DIAGNOSIS — I480 Paroxysmal atrial fibrillation: Secondary | ICD-10-CM | POA: Diagnosis not present

## 2020-08-28 DIAGNOSIS — F411 Generalized anxiety disorder: Secondary | ICD-10-CM | POA: Diagnosis not present

## 2020-08-28 DIAGNOSIS — Z1211 Encounter for screening for malignant neoplasm of colon: Secondary | ICD-10-CM | POA: Diagnosis not present

## 2020-08-28 DIAGNOSIS — E782 Mixed hyperlipidemia: Secondary | ICD-10-CM | POA: Diagnosis not present

## 2020-08-28 DIAGNOSIS — I10 Essential (primary) hypertension: Secondary | ICD-10-CM | POA: Diagnosis not present

## 2020-08-28 DIAGNOSIS — R7303 Prediabetes: Secondary | ICD-10-CM | POA: Diagnosis not present

## 2020-08-28 DIAGNOSIS — E039 Hypothyroidism, unspecified: Secondary | ICD-10-CM | POA: Diagnosis not present

## 2020-09-12 ENCOUNTER — Other Ambulatory Visit: Payer: Self-pay | Admitting: Cardiovascular Disease

## 2020-09-16 NOTE — Progress Notes (Deleted)
Cardiology Office Note   Date:  09/16/2020   ID:  Andrea Harding, DOB Jan 08, 1951, MRN [id number]  PCP:  Georganna Skeans, PA-C  Cardiologist:   Jenkins Rouge, MD   No chief complaint on file.     History of Present Illness: Andrea Harding is a 70 y.o. female f/u chronic afib and HTN  TTE reviewed from 05/01/19 EF 40-45% global mild MR/AR LAE 41 mm  Wanted to start Entresto but patient preferred ARB    She is asymptomatic No chest pain , fatigue palpitations or dyspnea.    PA in [place] checks INR every 4 weeks no bleeding issues    Daughter / son in law died and they are trying to care for 4 of their young kids Neither she or husband will get vaccine He actually had COVID   Discussed DOAC rather than coumadin but she indicated a doctor told her if she gets a brain bleed She will have more issues with DOAC than coumadin I told her she is more likely to get a brain bleed On coumadin in the first place   F/U echo 05/02/20 showed EF normalized 56% with mild / moderate MR  Seen by allergy/asthma for laryngopharyngeal reflux disease Rx Breztri, Allegra and H2 blockers   ***  Past Medical History:  Diagnosis Date   A-fib ([place])    Acquired autoimmune hypothyroidism    Anxiety    Asthma    Asthma with bronchitis and status asthmaticus    Blood sugar increased    BMI 26.0-26.9,adult    Borderline hyperlipidemia    Chronic reflux esophagitis    Dark stools    Depression    Drug therapy    Hypertension    Hypothyroidism    Leg swelling    Neoplasm of uncertain behavior of skin    Pemphigoid    Skin cancer    Vaginal dryness    Vaginitis, atrophic    Varicella vaccination    Vitamin D deficiency     Past Surgical History:  Procedure Laterality Date   ABDOMINAL HYSTERECTOMY  1982   ANGLE FOOT Right    2009   No Name CELL CARCINOMA EXCISION Right    07/24/15   TONSILLECTOMY  1961     Current Outpatient Medications   Medication Sig Dispense Refill   Biotin w/ Vitamins C & E (HAIR/SKIN/NAILS PO) Take by mouth.     Budeson-Glycopyrrol-Formoterol (BREZTRI AEROSPHERE) 160-9-4.8 MCG/ACT AERO Inhale two puffs twice daily to prevent cough or wheeze.  Rinse, gargle, and spit after use. 10.7 g 5   clonazePAM (KLONOPIN) 0.5 MG tablet Take 0.25 mg by mouth 3 (three) times daily as needed.     famotidine (PEPCID) 40 MG tablet Take 1 tablet (40 mg total) by mouth at bedtime. 30 tablet 5   levothyroxine (SYNTHROID, LEVOTHROID) 175 MCG tablet TAKE 1/2 TABLET BY MOUTH DAILY     losartan (COZAAR) 50 MG tablet Take 0.5 tablets by mouth daily.     metoprolol succinate (TOPROL-XL) 25 MG 24 hr tablet Take 1 tablet by mouth daily.     montelukast (SINGULAIR) 10 MG tablet Take 1 tablet by mouth at bedtime.     Multiple Vitamin (MULTIVITAMIN PO) Take by mouth.     Nutritional Supplements (NUTRITIONAL SUPPLEMENT PO) Take by mouth. Supplement for memory support, patient unsure of name     omeprazole (PRILOSEC) 40 MG capsule Take one  capsule by mouth twice daily as directed. 60 capsule 5   venlafaxine XR (EFFEXOR-XR) 37.5 MG 24 hr capsule Take 37.5 mg by mouth daily.     VENTOLIN HFA 108 (90 Base) MCG/ACT inhaler Inhale 2 puffs into the lungs every 4 (four) hours as needed.     warfarin (COUMADIN) 5 MG tablet Take 1 tablet by mouth as directed. Take 1 tablet by mouth once a day except Fridays take 1.5 tablets     No current facility-administered medications for this visit.    Allergies:   Amoxicillin, Cephalexin, Other, Penicillins, and Sulfa antibiotics    Social History:  The patient  reports that she has never smoked. She has never used smokeless tobacco. She reports that she does not drink alcohol and does not use drugs.   Family History:  The patient's family history includes CVA in her father; Diabetes in her mother; Heart attack in her mother; Heart disease in her father; Hypertension in her father; Thyroid disease in her  mother.    ROS:  Please see the history of present illness.   Otherwise, review of systems are positive for none.   All other systems are reviewed and negative.    PHYSICAL EXAM: VS:  There were no vitals taken for this visit. , BMI There is no height or weight on file to calculate BMI. Affect appropriate Healthy:  appears stated age 82: normal Neck supple with no adenopathy JVP normal no bruits no thyromegaly Lungs clear with no wheezing and good diaphragmatic motion Heart:  S1/S2 apical MR murmur, no rub, gallop or click PMI normal Abdomen: benighn, BS positve, no tenderness, no AAA no bruit.  No HSM or HJR Distal pulses intact with no bruits No edema Neuro non-focal Skin warm and dry No muscular weakness   EKG:   05/01/19 afib rate 72 nonspecific ST changes    Recent Labs: No results found for requested labs within last 8760 hours.    Lipid Panel No results found for: CHOL, TRIG, HDL, CHOLHDL, VLDL, LDLCALC, LDLDIRECT    Wt Readings from Last 3 Encounters:  07/18/20 80.5 kg  09/22/19 77.7 kg  05/01/19 79.4 kg     Other studies Reviewed: Additional studies/ records that were reviewed today include: Notes from DR [facility] labs, ECG .Echo 05/01/19   ASSESSMENT AND PLAN:  1.  Afib: Previous discussion regarding  rate control and anticoagulation vs conversion strategy Being asymptomatic decided on rate control and anticoagulation Currently on coumadin Discussed DOAC and she prefers to stay on coumadin   2. Thyroid:  TSH normal continue synthroid replacement  3. HTN:  Well controlled.  Continue current medications and low sodium Dash type diet.    4. Depression: continue Celexa mood seems appropriate  5. Mild Systolic Dysfunction :  No signs / symptoms CHF continue toprol and losartan  Had recommended Entresto but cost prohibitive TTE 05/02/20 EF normalized with mild / moderate MR    Current medicines are reviewed at length with the patient today.  The patient does  not have concerns regarding medicines.  The following changes have been made:  None   Labs/ tests ordered today include: Echo March 2023 afib/low EF   No orders of the defined types were placed in this encounter.    Disposition:   FU with me in a year       Signed, Jenkins Rouge, MD  09/16/2020 2:11 PM    [place], [place]  [address] Phone: [phone]; Fax: [phone]

## 2020-09-20 ENCOUNTER — Ambulatory Visit: Payer: Medicare Other | Admitting: Cardiovascular Disease

## 2020-09-25 DIAGNOSIS — H1132 Conjunctival hemorrhage, left eye: Secondary | ICD-10-CM | POA: Diagnosis not present

## 2020-09-25 DIAGNOSIS — F331 Major depressive disorder, recurrent, moderate: Secondary | ICD-10-CM | POA: Diagnosis not present

## 2020-09-25 DIAGNOSIS — I480 Paroxysmal atrial fibrillation: Secondary | ICD-10-CM | POA: Diagnosis not present

## 2020-09-25 DIAGNOSIS — F411 Generalized anxiety disorder: Secondary | ICD-10-CM | POA: Diagnosis not present

## 2020-10-28 ENCOUNTER — Other Ambulatory Visit: Payer: Self-pay | Admitting: Allergy and Immunology

## 2020-10-29 ENCOUNTER — Other Ambulatory Visit: Payer: Self-pay | Admitting: Allergy and Immunology

## 2020-10-30 DIAGNOSIS — K5900 Constipation, unspecified: Secondary | ICD-10-CM | POA: Diagnosis not present

## 2020-10-30 DIAGNOSIS — R109 Unspecified abdominal pain: Secondary | ICD-10-CM | POA: Diagnosis not present

## 2020-10-30 DIAGNOSIS — R110 Nausea: Secondary | ICD-10-CM | POA: Diagnosis not present

## 2020-11-01 DIAGNOSIS — I480 Paroxysmal atrial fibrillation: Secondary | ICD-10-CM | POA: Diagnosis not present

## 2020-11-01 DIAGNOSIS — Z7901 Long term (current) use of anticoagulants: Secondary | ICD-10-CM | POA: Diagnosis not present

## 2020-11-27 DIAGNOSIS — Z1231 Encounter for screening mammogram for malignant neoplasm of breast: Secondary | ICD-10-CM | POA: Diagnosis not present

## 2020-12-04 DIAGNOSIS — I480 Paroxysmal atrial fibrillation: Secondary | ICD-10-CM | POA: Diagnosis not present

## 2020-12-04 DIAGNOSIS — Z7901 Long term (current) use of anticoagulants: Secondary | ICD-10-CM | POA: Diagnosis not present

## 2020-12-11 DIAGNOSIS — Z08 Encounter for follow-up examination after completed treatment for malignant neoplasm: Secondary | ICD-10-CM | POA: Diagnosis not present

## 2020-12-11 DIAGNOSIS — Z85828 Personal history of other malignant neoplasm of skin: Secondary | ICD-10-CM | POA: Diagnosis not present

## 2020-12-11 DIAGNOSIS — D229 Melanocytic nevi, unspecified: Secondary | ICD-10-CM | POA: Diagnosis not present

## 2020-12-26 DIAGNOSIS — Z881 Allergy status to other antibiotic agents status: Secondary | ICD-10-CM | POA: Diagnosis not present

## 2020-12-26 DIAGNOSIS — Z7901 Long term (current) use of anticoagulants: Secondary | ICD-10-CM | POA: Diagnosis not present

## 2020-12-26 DIAGNOSIS — F32A Depression, unspecified: Secondary | ICD-10-CM | POA: Diagnosis not present

## 2020-12-26 DIAGNOSIS — R6884 Jaw pain: Secondary | ICD-10-CM | POA: Diagnosis not present

## 2020-12-26 DIAGNOSIS — E079 Disorder of thyroid, unspecified: Secondary | ICD-10-CM | POA: Diagnosis not present

## 2020-12-26 DIAGNOSIS — Z79899 Other long term (current) drug therapy: Secondary | ICD-10-CM | POA: Diagnosis not present

## 2020-12-26 DIAGNOSIS — Z9109 Other allergy status, other than to drugs and biological substances: Secondary | ICD-10-CM | POA: Diagnosis not present

## 2020-12-26 DIAGNOSIS — Z91048 Other nonmedicinal substance allergy status: Secondary | ICD-10-CM | POA: Diagnosis not present

## 2020-12-26 DIAGNOSIS — F419 Anxiety disorder, unspecified: Secondary | ICD-10-CM | POA: Diagnosis not present

## 2020-12-26 DIAGNOSIS — K0889 Other specified disorders of teeth and supporting structures: Secondary | ICD-10-CM | POA: Diagnosis not present

## 2020-12-26 DIAGNOSIS — Z79890 Hormone replacement therapy (postmenopausal): Secondary | ICD-10-CM | POA: Diagnosis not present

## 2020-12-26 DIAGNOSIS — I4891 Unspecified atrial fibrillation: Secondary | ICD-10-CM | POA: Diagnosis not present

## 2020-12-26 DIAGNOSIS — Z882 Allergy status to sulfonamides status: Secondary | ICD-10-CM | POA: Diagnosis not present

## 2020-12-26 DIAGNOSIS — I110 Hypertensive heart disease with heart failure: Secondary | ICD-10-CM | POA: Diagnosis not present

## 2020-12-26 DIAGNOSIS — I509 Heart failure, unspecified: Secondary | ICD-10-CM | POA: Diagnosis not present

## 2021-01-02 ENCOUNTER — Other Ambulatory Visit: Payer: Self-pay | Admitting: Allergy and Immunology

## 2021-01-08 DIAGNOSIS — Z7901 Long term (current) use of anticoagulants: Secondary | ICD-10-CM | POA: Diagnosis not present

## 2021-01-08 DIAGNOSIS — I480 Paroxysmal atrial fibrillation: Secondary | ICD-10-CM | POA: Diagnosis not present

## 2021-01-30 DIAGNOSIS — Z7901 Long term (current) use of anticoagulants: Secondary | ICD-10-CM | POA: Diagnosis not present

## 2021-01-30 DIAGNOSIS — I480 Paroxysmal atrial fibrillation: Secondary | ICD-10-CM | POA: Diagnosis not present

## 2021-01-30 DIAGNOSIS — M25551 Pain in right hip: Secondary | ICD-10-CM | POA: Diagnosis not present

## 2021-01-30 DIAGNOSIS — Z5181 Encounter for therapeutic drug level monitoring: Secondary | ICD-10-CM | POA: Diagnosis not present

## 2021-01-30 DIAGNOSIS — R791 Abnormal coagulation profile: Secondary | ICD-10-CM | POA: Diagnosis not present

## 2021-01-30 DIAGNOSIS — Z79899 Other long term (current) drug therapy: Secondary | ICD-10-CM | POA: Diagnosis not present

## 2021-02-02 ENCOUNTER — Other Ambulatory Visit: Payer: Self-pay | Admitting: Allergy and Immunology

## 2021-02-25 NOTE — Progress Notes (Signed)
Cardiology Office Note   Date:  02/25/2021   ID:  Andrea Harding, DOB Feb 20, 1951, MRN [id number]  PCP:  Georganna Skeans, PA-C  Cardiologist:   Jenkins Rouge, MD   No chief complaint on file.     History of Present Illness: Andrea Harding is a 70 y.o. female f/u chronic afib and HTN  TTE reviewed from 05/02/20 EF 55-60% improved mild/mod MR mild AR   TTE reviewed from 05/01/19 EF 40-45% global mild MR/AR LAE 41 mm  Wanted to start Entresto but patient preferred ARB    On Toprol and coumadin   She is asymptomatic No chest pain , fatigue palpitations or dyspnea.    PA in [place] checks INR every 4 weeks no bleeding issues   Daughter / son in law died and they are trying to care for 4 of their young kids Neither she or husband will get vaccine   Discussed DOAC multiple times she prefers to stay on coumadin    Past Medical History:  Diagnosis Date   A-fib ([place])    Acquired autoimmune hypothyroidism    Anxiety    Asthma    Asthma with bronchitis and status asthmaticus    Blood sugar increased    BMI 26.0-26.9,adult    Borderline hyperlipidemia    Chronic reflux esophagitis    Dark stools    Depression    Drug therapy    Hypertension    Hypothyroidism    Leg swelling    Neoplasm of uncertain behavior of skin    Pemphigoid    Skin cancer    Vaginal dryness    Vaginitis, atrophic    Varicella vaccination    Vitamin D deficiency     Past Surgical History:  Procedure Laterality Date   ABDOMINAL HYSTERECTOMY  1982   ANGLE FOOT Right    2009   [place] CELL CARCINOMA EXCISION Right    07/24/15   TONSILLECTOMY  1961     Current Outpatient Medications  Medication Sig Dispense Refill   Biotin w/ Vitamins C & E (HAIR/SKIN/NAILS PO) Take by mouth.     Budeson-Glycopyrrol-Formoterol (BREZTRI AEROSPHERE) 160-9-4.8 MCG/ACT AERO Inhale two puffs twice daily to prevent cough or wheeze.  Rinse, gargle, and spit after use.  10.7 g 5   clonazePAM (KLONOPIN) 0.5 MG tablet Take 0.25 mg by mouth 3 (three) times daily as needed.     famotidine (PEPCID) 40 MG tablet Take 1 tablet (40 mg total) by mouth at bedtime. 30 tablet 5   levothyroxine (SYNTHROID, LEVOTHROID) 175 MCG tablet TAKE 1/2 TABLET BY MOUTH DAILY     losartan (COZAAR) 50 MG tablet Take 0.5 tablets by mouth daily.     metoprolol succinate (TOPROL-XL) 25 MG 24 hr tablet Take 1 tablet by mouth daily.     montelukast (SINGULAIR) 10 MG tablet Take 1 tablet by mouth at bedtime.     Multiple Vitamin (MULTIVITAMIN PO) Take by mouth.     Nutritional Supplements (NUTRITIONAL SUPPLEMENT PO) Take by mouth. Supplement for memory support, patient unsure of name     omeprazole (PRILOSEC) 40 MG capsule Take one capsule by mouth twice daily as directed. 60 capsule 5   venlafaxine XR (EFFEXOR-XR) 37.5 MG 24 hr capsule Take 37.5 mg by mouth daily.     VENTOLIN HFA 108 (90 Base) MCG/ACT inhaler Inhale 2 puffs into the lungs every 4 (four) hours as needed.  warfarin (COUMADIN) 5 MG tablet Take 1 tablet by mouth as directed. Take 1 tablet by mouth once a day except Fridays take 1.5 tablets     No current facility-administered medications for this visit.    Allergies:   Amoxicillin, Cephalexin, Other, Penicillins, and Sulfa antibiotics    Social History:  The patient  reports that she has never smoked. She has never used smokeless tobacco. She reports that she does not drink alcohol and does not use drugs.   Family History:  The patient's family history includes CVA in her father; Diabetes in her mother; Heart attack in her mother; Heart disease in her father; Hypertension in her father; Thyroid disease in her mother.    ROS:  Please see the history of present illness.   Otherwise, review of systems are positive for none.   All other systems are reviewed and negative.    PHYSICAL EXAM: VS:  There were no vitals taken for this visit. , BMI There is no height or weight  on file to calculate BMI. Affect appropriate Healthy:  appears stated age 3: normal Neck supple with no adenopathy JVP normal no bruits no thyromegaly Lungs clear with no wheezing and good diaphragmatic motion Heart:  S1/S2 no murmur, no rub, gallop or click PMI normal Abdomen: benighn, BS positve, no tenderness, no AAA no bruit.  No HSM or HJR Distal pulses intact with no bruits No edema Neuro non-focal Skin warm and dry No muscular weakness   EKG:   05/01/19 afib rate 72 nonspecific ST changes 03/05/2021 afib  rate 79 normal    Recent Labs: No results found for requested labs within last 8760 hours.    Lipid Panel No results found for: CHOL, TRIG, HDL, CHOLHDL, VLDL, LDLCALC, LDLDIRECT    Wt Readings from Last 3 Encounters:  07/18/20 177 lb 6.4 oz (80.5 kg)  09/22/19 171 lb 6.4 oz (77.7 kg)  05/01/19 175 lb (79.4 kg)     Other studies Reviewed: Additional studies/ records that were reviewed today include: Notes from DR [facility] labs, ECG .Echo 05/01/19   ASSESSMENT AND PLAN:  1.  Afib: Previous discussion regarding  rate control and anticoagulation vs conversion strategy Being asymptomatic decided on rate control and anticoagulation Currently on coumadin Discussed DOAC and she prefers to stay on coumadin INR been Rx   2. Thyroid:  TSH normal continue synthroid replacement  3. HTN:  Well controlled.  Continue current medications and low sodium Dash type diet.    4. Depression: continue Celexa mood seems appropriate  5. Mild Systolic Dysfunction :  most recent echo done 05/02/20 showed EF 55-60% mild / mod MR and mild AR    Current medicines are reviewed at length with the patient today.  The patient does not have concerns regarding medicines.  The following changes have been made:  None   Labs/ tests ordered today include: Echo march 2023   No orders of the defined types were placed in this encounter.    Disposition:   FU with me in a year        Signed, Jenkins Rouge, MD  02/25/2021 4:58 PM    [facility] [address], [place], [place]  [address] Phone: [phone]; Fax: [phone]

## 2021-03-05 ENCOUNTER — Encounter: Payer: Self-pay | Admitting: Cardiovascular Disease

## 2021-03-05 ENCOUNTER — Ambulatory Visit (INDEPENDENT_AMBULATORY_CARE_PROVIDER_SITE_OTHER): Payer: Medicare Other | Admitting: Cardiovascular Disease

## 2021-03-05 ENCOUNTER — Other Ambulatory Visit: Payer: Self-pay

## 2021-03-05 VITALS — BP 130/86 | HR 79 | Ht 66.0 in | Wt 186.2 lb

## 2021-03-05 DIAGNOSIS — I4891 Unspecified atrial fibrillation: Secondary | ICD-10-CM | POA: Diagnosis not present

## 2021-03-05 DIAGNOSIS — I340 Nonrheumatic mitral (valve) insufficiency: Secondary | ICD-10-CM | POA: Diagnosis not present

## 2021-03-05 NOTE — Patient Instructions (Addendum)
Medication Instructions:  *If you need a refill on your cardiac medications before your next appointment, please call your pharmacy*  Lab Work: If you have labs (blood work) drawn today and your tests are completely normal, you will receive your results only by: [place] (if you have MyChart) OR A paper copy in the mail If you have any lab test that is abnormal or we need to change your treatment, we will call you to review the results.  Testing/Procedures: Your physician has requested that you have an echocardiogram in March. Echocardiography is a painless test that uses sound waves to create images of your heart. It provides your doctor with information about the size and shape of your heart and how well your hearts chambers and valves are working. This procedure takes approximately one hour. There are no restrictions for this procedure.  Follow-Up: At [facility], you and your health needs are our priority.  As part of our continuing mission to provide you with exceptional heart care, we have created designated Provider Care Teams.  These Care Teams include your primary Cardiologist (physician) and Advanced Practice Providers (APPs -  Physician Assistants and Nurse Practitioners) who all work together to provide you with the care you need, when you need it.  We recommend signing up for the patient portal called "MyChart".  Sign up information is provided on this After Visit Summary.  MyChart is used to connect with patients for Virtual Visits (Telemedicine).  Patients are able to view lab/test results, encounter notes, upcoming appointments, etc.  Non-urgent messages can be sent to your provider as well.   To learn more about what you can do with MyChart, go to [email or web address].    Your next appointment:   6 months  The format for your next appointment:   In Person  Provider:   Jenkins Rouge, MD {

## 2021-03-13 ENCOUNTER — Other Ambulatory Visit: Payer: Self-pay | Admitting: Allergy and Immunology

## 2021-03-20 ENCOUNTER — Ambulatory Visit (INDEPENDENT_AMBULATORY_CARE_PROVIDER_SITE_OTHER): Payer: Medicare Other

## 2021-03-20 ENCOUNTER — Ambulatory Visit (INDEPENDENT_AMBULATORY_CARE_PROVIDER_SITE_OTHER): Payer: Medicare Other | Admitting: Orthopaedic Surgery

## 2021-03-20 DIAGNOSIS — M25551 Pain in right hip: Secondary | ICD-10-CM | POA: Diagnosis not present

## 2021-03-20 NOTE — Progress Notes (Signed)
Office Visit Note   Patient: Andrea Harding           Date of Birth: 1950-06-09           MRN: [id number] Visit Date: 03/20/2021              Requested by: Georganna Skeans, PA-C [address] [place],  [place] [address] PCP: Georganna Skeans, PA-C   Assessment & Plan: Visit Diagnoses:  1. Pain in right hip     Plan: I did give her reassurance that there was no hip fracture or broken bone that can be seen.  This should improve with time.  She can try Tylenol and even over-the-counter topical anti-inflammatories like Voltaren gel.  She was pleased that there was not a fracture.  All questions and concerns were answered addressed.  Follow-up is as needed.  Follow-Up Instructions: Return if symptoms worsen or fail to improve.   Orders:  Orders Placed This Encounter  Procedures   XR HIP UNILAT W OR W/O PELVIS 1V RIGHT   No orders of the defined types were placed in this encounter.     Procedures: No procedures performed   Clinical Data: No additional findings.   Subjective: Chief Complaint  Patient presents with   Right Hip - Pain  The patient comes in for evaluation treatment of right hip pain that has been going on for only about a month after mechanical fall.  She points to the iliac crest is more the source of her pain.  She is 71 years old and active.  She does have congestive heart failure and a heart valve issue.  She is not a diabetic.  She had 2 heart falls within a day apart or less injuring that hip.  She has been able to ambulate and does not use an assistive device when she ambulates.  She says it is more painful when she goes from a sitting to a standing position and points again to the right pelvic area as a source of her pain.  HPI  Review of Systems There is no listed headache, chest pain, shortness of breath, fever, chills, nausea, vomiting  Objective: Vital Signs: There were no vitals taken for this visit.  Physical Exam She is alert and orient x3 and in no  acute distress Ortho Exam Examination of her right hip shows it moves smoothly and fluidly with no pain at all with compression of the hip or with rotation of the hip.  She is tender over the iliac crest area which is only mild. Specialty Comments:  No specialty comments available.  Imaging: XR HIP UNILAT W OR W/O PELVIS 1V RIGHT  Result Date: 03/20/2021 An AP pelvis lateral right hip shows no acute findings.  There is no evidence of fracture.  The hip joint space is well-maintained.    PMFS History: Patient Active Problem List   Diagnosis Date Noted   Cellulitis of left lower extremity 09/07/2019   Asthma 08/28/2019   Atrophic vaginitis 08/28/2019   Vitamin D deficiency 08/28/2019   Mixed hyperlipidemia 08/15/2019   Acquired hypothyroidism 08/14/2019   Essential hypertension 08/14/2019   Generalized anxiety disorder 08/14/2019   Heart failure ([facility]) 08/14/2019   Paroxysmal A-fib ([facility]) 08/14/2019   Prediabetes 08/14/2019   Acquired dysmorphic toenail 10/13/2018   Past Medical History:  Diagnosis Date   A-fib [facility])    Acquired autoimmune hypothyroidism    Anxiety    Asthma    Asthma with bronchitis and status  asthmaticus    Blood sugar increased    BMI 26.0-26.9,adult    Borderline hyperlipidemia    Chronic reflux esophagitis    Dark stools    Depression    Drug therapy    Hypertension    Hypothyroidism    Leg swelling    Neoplasm of uncertain behavior of skin    Pemphigoid    Skin cancer    Vaginal dryness    Vaginitis, atrophic    Varicella vaccination    Vitamin D deficiency     Family History  Problem Relation Age of Onset   Heart attack Mother    Diabetes Mother    Thyroid disease Mother    Hypertension Father    Heart disease Father    CVA Father     Past Surgical History:  Procedure Laterality Date   ABDOMINAL HYSTERECTOMY  1982   ANGLE FOOT Right    2009   CESAREAN [place]   SQUAMOUS CELL CARCINOMA EXCISION Right     07/24/15   TONSILLECTOMY  1961   Social History   Occupational History   Not on file  Tobacco Use   Smoking status: Never   Smokeless tobacco: Never  Vaping Use   Vaping Use: Never used  Substance and Sexual Activity   Alcohol use: Never   Drug use: Never   Sexual activity: Not on file    Comment: MARRIED

## 2021-05-07 ENCOUNTER — Other Ambulatory Visit: Payer: Self-pay

## 2021-05-07 ENCOUNTER — Ambulatory Visit (HOSPITAL_COMMUNITY): Payer: Medicare Other | Attending: Cardiovascular Disease

## 2021-05-07 DIAGNOSIS — I4891 Unspecified atrial fibrillation: Secondary | ICD-10-CM

## 2021-05-07 LAB — ECHOCARDIOGRAM COMPLETE
P 1/2 time: 394 msec
S' Lateral: 2.9 cm

## 2021-07-10 ENCOUNTER — Other Ambulatory Visit: Payer: Self-pay | Admitting: Allergy and Immunology

## 2021-08-25 ENCOUNTER — Other Ambulatory Visit: Payer: Self-pay | Admitting: Allergy and Immunology

## 2021-09-04 ENCOUNTER — Telehealth: Payer: Self-pay | Admitting: Cardiovascular Disease

## 2021-09-04 DIAGNOSIS — R0602 Shortness of breath: Secondary | ICD-10-CM

## 2021-09-04 DIAGNOSIS — I519 Heart disease, unspecified: Secondary | ICD-10-CM

## 2021-09-04 DIAGNOSIS — R609 Edema, unspecified: Secondary | ICD-10-CM

## 2021-09-04 DIAGNOSIS — I4891 Unspecified atrial fibrillation: Secondary | ICD-10-CM

## 2021-09-04 NOTE — Telephone Encounter (Signed)
Pt c/o swelling: STAT is pt has developed SOB within 24 hours  If swelling, where is the swelling located?   Both legs, more in Left leg  How much weight have you gained and in what time span?  Unknown  Have you gained 3 pounds in a day or 5 pounds in a week?  Possibly  Do you have a log of your daily weights (if so, list)? No  Are you currently taking a fluid pill?  No  Are you currently SOB?   No  Have you traveled recently?   No   Patient stated that she can feel the weight difference in her legs and her chest feels heavy all of the time.    Patient has an appointment scheduled on  09/17/21.

## 2021-09-04 NOTE — Telephone Encounter (Signed)
Left message for patient to call back  

## 2021-09-05 NOTE — Telephone Encounter (Signed)
Left second message for patient to call back.  Per Dr. Johnsie Cancel, Doubt her weight gain has anything to do with heart, EF normal March. Needs BMET/TSH/BNP.

## 2021-09-05 NOTE — Telephone Encounter (Signed)
Pt returning call. Transferred to Michaelyn Barter, RN.

## 2021-09-05 NOTE — Progress Notes (Signed)
Cardiology Office Note   Date:  09/17/2021   ID:  Andrea Harding, DOB [birth year], MRN [id number]  PCP:  Georganna Skeans, PA-C  Cardiologist:   Jenkins Rouge, MD   No chief complaint on file.     History of Present Illness: Andrea Harding is a 71 y.o. female f/u chronic afib and HTN  TTE reviewed from 05/07/21 EF 60-65% mild MR mild AR TTE reviewed from 05/02/20 EF 55-60% improved mild/mod MR mild AR  TTE reviewed from 05/01/19 EF 40-45% global mild MR/AR LAE 41 mm  Wanted to start Entresto but patient preferred ARB    On Toprol and coumadin   She is asymptomatic No chest pain , fatigue palpitations or dyspnea.    PA in [place] checks INR every 4 weeks no bleeding issues   Daughter / son in law died and they are trying to care for 4 of their young kids Neither she or husband will get vaccine   Discussed DOAC multiple times she prefers to stay on coumadin  She called office and complained of increased weight LE edema left > right And tightness in chest all the time Saw Dr Felicie Morn 03/20/21 for right hip pain and he Reassured her she had no fracture   She had some asthma exacerbation that likely accounted for this Improved with inhaler and given prednisone taper which caused some fluid retention improved now She wears compression stockings now    Past Medical History:  Diagnosis Date   A-fib ([facility])    Acquired autoimmune hypothyroidism    Anxiety    Asthma    Asthma with bronchitis and status asthmaticus    Blood sugar increased    BMI 26.0-26.9,adult    Borderline hyperlipidemia    Chronic reflux esophagitis    Congestive heart failure (CHF) (HCC)    Dark stools    Depression    Drug therapy    Hypertension    Hypothyroidism    Leg swelling    Neoplasm of uncertain behavior of skin    Pemphigoid    Skin cancer    Vaginal dryness    Vaginitis, atrophic    Varicella vaccination    Vitamin D deficiency     Past Surgical History:  Procedure Laterality Date    ABDOMINAL HYSTERECTOMY  1982   ANGLE FOOT Right    2009   [place] CELL CARCINOMA EXCISION Right    07/24/15   TONSILLECTOMY  1961     Current Outpatient Medications  Medication Sig Dispense Refill   ADVAIR DISKUS 250-50 MCG/ACT AEPB Inhale one dose twice daily to prevent cough or wheeze.  Rinse, gargle, and spit after use. 60 each 5   amitriptyline (ELAVIL) 10 MG tablet Take 10 mg by mouth at bedtime.     Biotin w/ Vitamins C & E (HAIR/SKIN/NAILS PO) Take by mouth.     CALCIUM PO Take by mouth.     citalopram (CELEXA) 40 MG tablet Take 40 mg by mouth every morning.     clonazePAM (KLONOPIN) 0.5 MG tablet Take 1 tablet by mouth 2 (two) times daily as needed.     colestipol (COLESTID) 1 g tablet Can take one tablet one to two times daily as directed. 60 tablet 5   famotidine (PEPCID) 40 MG tablet Take 1 tablet (40 mg total) by mouth at bedtime. 90 tablet 1   levothyroxine (SYNTHROID, LEVOTHROID) 175 MCG tablet TAKE  1/2 TABLET BY MOUTH DAILY     losartan (COZAAR) 25 MG tablet Take 25 mg by mouth daily.     MELATONIN PO Take by mouth.     metoprolol succinate (TOPROL-XL) 25 MG 24 hr tablet Take 1 tablet by mouth daily.     Multiple Vitamin (MULTIVITAMIN PO) Take by mouth.     OLANZapine (ZYPREXA) 5 MG tablet Take 5 mg by mouth at bedtime.     omeprazole (PRILOSEC) 40 MG capsule Take one capsule by mouth twice daily as directed. 60 capsule 5   VENTOLIN HFA 108 (90 Base) MCG/ACT inhaler Inhale 2 puffs into the lungs every 4 (four) hours as needed.     VITAMIN D PO Take by mouth.     warfarin (COUMADIN) 5 MG tablet Take 1 tablet by mouth as directed. Take 1 tablet by mouth once a day except Fridays take 1.5 tablets     No current facility-administered medications for this visit.    Allergies:   Amoxicillin-pot clavulanate, Amoxicillin, Cephalexin, Other, Penicillins, and Sulfa antibiotics    Social History:  The patient  reports that she has  never smoked. She has never used smokeless tobacco. She reports that she does not drink alcohol and does not use drugs.   Family History:  The patient's family history includes CVA in her father; Diabetes in her mother; Heart attack in her mother; Heart disease in her father; Hypertension in her father; Thyroid disease in her mother.    ROS:  Please see the history of present illness.   Otherwise, review of systems are positive for none.   All other systems are reviewed and negative.    PHYSICAL EXAM: VS:  BP 136/70   Pulse 60   Ht 5' 5.5" (1.664 m)   Wt 189 lb 4 oz (85.8 kg)   SpO2 98%   BMI 31.01 kg/m  , BMI Body mass index is 31.01 kg/m. Affect appropriate Healthy:  appears stated age 61: normal Neck supple with no adenopathy JVP normal no bruits no thyromegaly Lungs clear with no wheezing and good diaphragmatic motion Heart:  S1/S2 no murmur, no rub, gallop or click PMI normal Abdomen: benighn, BS positve, no tenderness, no AAA no bruit.  No HSM or HJR Distal pulses intact with no bruits No edema Neuro non-focal Skin warm and dry No muscular weakness   EKG:   05/01/19 afib rate 72 nonspecific ST changes 09/17/2021 afib  rate 79 normal    Recent Labs: 09/05/2021: BUN 18; Creatinine, Ser 0.90; NT-Pro BNP 1,039; Potassium 3.9; Sodium 140; TSH 1.280    Lipid Panel No results found for: "CHOL", "TRIG", "HDL", "CHOLHDL", "VLDL", "LDLCALC", "LDLDIRECT"    Wt Readings from Last 3 Encounters:  09/17/21 189 lb 4 oz (85.8 kg)  09/10/21 191 lb 3.2 oz (86.7 kg)  03/05/21 186 lb 3.2 oz (84.5 kg)     Other studies Reviewed: Additional studies/ records that were reviewed today include: Notes from [facility] labs, ECG .Echo 05/01/19   ASSESSMENT AND PLAN:  1.  Afib: Previous discussion regarding  rate control and anticoagulation vs conversion strategy Being asymptomatic decided on rate control and anticoagulation Currently on coumadin Discussed DOAC and she prefers to stay on  coumadin INR been Rx   2. Thyroid:  TSH normal continue synthroid replacement  3. HTN:  Well controlled.  Continue current medications and low sodium Dash type diet.    4. Depression: continue Celexa mood seems appropriate  5. Mild Systolic Dysfunction :  normalized on most recent echo 05/07/21 with only stable mild AR/MR  6. Edema:  non on exam today doubt CHF see below regarding labs and PRN lasix Continue compression stockings and low sodium diet    Current medicines are reviewed at length with the patient today.  The patient does not have concerns regarding medicines.  The following changes have been made:  None   Labs/ tests ordered today include: BMET/BNP Lasix 20 mg PRN  No orders of the defined types were placed in this encounter.    Disposition:   FU with me in a year       Signed, Jenkins Rouge, MD  09/17/2021 10:14 AM    [facility] [place], [place], [place]  [address] Phone: [phone]; Fax: [phone]

## 2021-09-05 NOTE — Telephone Encounter (Signed)
Pt is returning call. Pt  is requesting call back.

## 2021-09-05 NOTE — Telephone Encounter (Signed)
Patient called back. Call sent to nurse. Informed patient of Dr. Kyla Balzarine response. Patient will go get lab work today. Orders have been placed.

## 2021-09-05 NOTE — Telephone Encounter (Signed)
Left message for patient to call back  

## 2021-09-06 LAB — BASIC METABOLIC PANEL
BUN/Creatinine Ratio: 20 (ref 12–28)
BUN: 18 mg/dL (ref 8–27)
CO2: 22 mmol/L (ref 20–29)
Calcium: 8.9 mg/dL (ref 8.7–10.3)
Chloride: 103 mmol/L (ref 96–106)
Creatinine, Ser: 0.9 mg/dL (ref 0.57–1.00)
Glucose: 116 mg/dL — ABNORMAL HIGH (ref 70–99)
Potassium: 3.9 mmol/L (ref 3.5–5.2)
Sodium: 140 mmol/L (ref 134–144)
eGFR: 68 mL/min/{1.73_m2} (ref >59)

## 2021-09-06 LAB — PRO B NATRIURETIC PEPTIDE: NT-Pro BNP: 1039 pg/mL — ABNORMAL HIGH (ref 0–301)

## 2021-09-06 LAB — TSH: TSH: 1.28 u[IU]/mL (ref 0.450–4.500)

## 2021-09-10 ENCOUNTER — Encounter: Payer: Self-pay | Admitting: Allergy and Immunology

## 2021-09-10 ENCOUNTER — Ambulatory Visit (INDEPENDENT_AMBULATORY_CARE_PROVIDER_SITE_OTHER): Payer: Medicare Other | Admitting: Allergy and Immunology

## 2021-09-10 ENCOUNTER — Telehealth: Payer: Self-pay

## 2021-09-10 VITALS — BP 138/72 | HR 72 | Resp 14 | Ht 65.5 in | Wt 191.2 lb

## 2021-09-10 DIAGNOSIS — J3089 Other allergic rhinitis: Secondary | ICD-10-CM | POA: Diagnosis not present

## 2021-09-10 DIAGNOSIS — K219 Gastro-esophageal reflux disease without esophagitis: Secondary | ICD-10-CM

## 2021-09-10 DIAGNOSIS — J4550 Severe persistent asthma, uncomplicated: Secondary | ICD-10-CM

## 2021-09-10 DIAGNOSIS — D7219 Other eosinophilia: Secondary | ICD-10-CM | POA: Diagnosis not present

## 2021-09-10 DIAGNOSIS — K529 Noninfective gastroenteritis and colitis, unspecified: Secondary | ICD-10-CM

## 2021-09-10 MED ORDER — COLESTIPOL HCL 1 G PO TABS
ORAL_TABLET | ORAL | 5 refills | Status: AC
Start: 2021-09-10 — End: ?

## 2021-09-10 MED ORDER — BREO ELLIPTA 200-25 MCG/ACT IN AEPB: INHALATION_SPRAY | RESPIRATORY_TRACT | 5 refills | Status: DC

## 2021-09-10 MED ORDER — FAMOTIDINE 40 MG PO TABS: 40.0000 mg | ORAL_TABLET | Freq: Every day | ORAL | 1 refills | Status: DC

## 2021-09-10 MED ORDER — ADVAIR DISKUS 250-50 MCG/ACT IN AEPB: INHALATION_SPRAY | RESPIRATORY_TRACT | 5 refills | Status: AC

## 2021-09-10 NOTE — Patient Instructions (Signed)
  1.  Treat and prevent inflammation:   A. Breo 200 - 1 inhalations 1 times per day (empty lungs)  B. Prednisone 10 mg - 1 tablet 1 time per day for 10 days only  2.  Treat and prevent reflux/LPR:   A. Omeprazole 40 mg - 1 tablet 2 times per day  B. Famotidine 40 mg - 1 tablet in evening  3. Treat and prevent diarrhea:   A. Colestipol 1 gm - 1 tablet 1-2 times per day  4.  If needed:   A. Cetirizine 10 mg - 1 tablet 1-2 times per day  B. Mucinex DM - 1-2 tablets 1-2 times per day  C. Albuterol HFA - 2 inhalations every 4-6 hours  4.  Return to clinic in 4 weeks or earlier if problem

## 2021-09-10 NOTE — Telephone Encounter (Signed)
Talked with [facility]- Breo 200 is going to be over $400.  Sent in [place], but that one was going to be over $400 as well.  Looks like patient has a deductible that has to be met.  Per Dr. Neldon Mc, patient can try samples of Breo 100.  Called patient and informed her that we have samples for her to pick up.

## 2021-09-10 NOTE — Progress Notes (Unsigned)
[facility]   Follow-up Note  Referring Provider: Nolen Mu Primary Provider: Nolen Mu Date of Office Visit: 09/10/2021  Subjective:   Andrea Harding (DOB: May 19, 1950) is a 71 y.o. female who returns to the [facility] on 09/10/2021 in re-evaluation of the following:  HPI: Kiira returns to this clinic in evaluation of asthma, allergic rhinitis, LPR.  I last saw her in this clinic during her initial evaluation of 18 Jul 2020.  During her last visit we addressed the issue of LPR and she has been consistently using treatment for this issue and thinks that overall she is done very good up until the past 2 months or so at which point in time she is developed a little bit more problems with throat clearing and some postnasal drip.  Fortunately, she has not redeveloped her unrelenting coughing episodes.  She also appears to have a component of inflammation affecting her airway and we gave her an inhaled steroid to use but because of pricing she has not used this medication very often.  She does not really have any shortness of breath or chest tightness and has no need to use any albuterol.  She has very little problems with her upper airway.  She does state that she has had a multiyear history of having postprandial diarrhea.  This is a consistent issue after eating.  Sometimes the material emanating from her rectum is very soft and sometimes it is watery.  She apparently had a colonoscopy performed a few months ago which did not identify any significant abnormality.  Allergies as of 09/10/2021       Reactions   Amoxicillin-pot Clavulanate Other (See Comments)   Other reaction(s): Myalgia Difficulty swallowing SEVERE   Amoxicillin Other (See Comments)   Muscle pain   Cephalexin    Other    SULFA DRUGS   Penicillins    Sulfa Antibiotics Swelling, Other (See Comments)   ask        Medication List     amitriptyline 10 MG tablet Commonly known as: ELAVIL Take 10 mg by mouth at bedtime.   Breztri Aerosphere 160-9-4.8 MCG/ACT Aero Generic drug: Budeson-Glycopyrrol-Formoterol Inhale two puffs twice daily to prevent cough or wheeze.  Rinse, gargle, and spit after use.   CALCIUM PO Take by mouth.   citalopram 40 MG tablet Commonly known as: CELEXA Take 40 mg by mouth every morning.   clonazePAM 0.5 MG tablet Commonly known as: KLONOPIN Take 1 tablet by mouth 2 (two) times daily as needed.   famotidine 40 MG tablet Commonly known as: PEPCID Take 1 tablet (40 mg total) by mouth at bedtime.   HAIR/SKIN/NAILS PO Take by mouth.   levothyroxine 175 MCG tablet Commonly known as: SYNTHROID TAKE 1/2 TABLET BY MOUTH DAILY   losartan 25 MG tablet Commonly known as: COZAAR Take 25 mg by mouth daily.   MELATONIN PO Take by mouth.   metoprolol succinate 25 MG 24 hr tablet Commonly known as: TOPROL-XL Take 1 tablet by mouth daily.   MULTIVITAMIN PO Take by mouth.   OLANZapine 5 MG tablet Commonly known as: ZYPREXA Take 5 mg by mouth at bedtime.   omeprazole 40 MG capsule Commonly known as: PRILOSEC Take one capsule by mouth twice daily as directed.   Ventolin HFA 108 (90 Base) MCG/ACT inhaler Generic drug: albuterol Inhale 2 puffs into the lungs every 4 (four) hours as needed.  VITAMIN D PO Take by mouth.   warfarin 5 MG tablet Commonly known as: COUMADIN Take 1 tablet by mouth as directed. Take 1 tablet by mouth once a day except Fridays take 1.5 tablets    Past Medical History:  Diagnosis Date   A-fib ([place])    Acquired autoimmune hypothyroidism    Anxiety    Asthma    Asthma with bronchitis and status asthmaticus    Blood sugar increased    BMI 26.0-26.9,adult    Borderline hyperlipidemia    Chronic reflux esophagitis    Congestive heart failure (CHF) ([facility])    Dark stools    Depression    Drug therapy    Hypertension    Hypothyroidism    Leg  swelling    Neoplasm of uncertain behavior of skin    Pemphigoid    Skin cancer    Vaginal dryness    Vaginitis, atrophic    Varicella vaccination    Vitamin D deficiency     Past Surgical History:  Procedure Laterality Date   ABDOMINAL HYSTERECTOMY  1982   ANGLE FOOT Right    2009   [place] CELL CARCINOMA EXCISION Right    07/24/15   TONSILLECTOMY  1961    Review of systems negative except as noted in HPI / PMHx or noted below:  Review of Systems  Constitutional: Negative.   HENT: Negative.    Eyes: Negative.   Respiratory: Negative.    Cardiovascular: Negative.   Gastrointestinal: Negative.   Genitourinary: Negative.   Musculoskeletal: Negative.   Skin: Negative.   Neurological: Negative.   Endo/Heme/Allergies: Negative.   Psychiatric/Behavioral: Negative.       Objective:   Vitals:   09/10/21 1510  BP: 138/72  Pulse: 72  Resp: 14  SpO2: 98%   Height: 5' 5.5" (166.4 cm)  Weight: 191 lb 3.2 oz (86.7 kg)   Physical Exam Constitutional:      Appearance: She is not diaphoretic.     Comments: Slightly raspy voice  HENT:     Head: Normocephalic.     Right Ear: Tympanic membrane, ear canal and external ear normal.     Left Ear: Tympanic membrane, ear canal and external ear normal.     Nose: Nose normal. No mucosal edema or rhinorrhea.     Mouth/Throat:     Pharynx: Uvula midline. No oropharyngeal exudate.  Eyes:     Conjunctiva/sclera: Conjunctivae normal.  Neck:     Thyroid: No thyromegaly.     Trachea: Trachea normal. No tracheal tenderness or tracheal deviation.  Cardiovascular:     Rate and Rhythm: Normal rate and regular rhythm.     Heart sounds: Normal heart sounds, S1 normal and S2 normal. No murmur heard. Pulmonary:     Effort: No respiratory distress.     Breath sounds: Normal breath sounds. No stridor. No wheezing or rales.  Lymphadenopathy:     Head:     Right side of head: No tonsillar  adenopathy.     Left side of head: No tonsillar adenopathy.     Cervical: No cervical adenopathy.  Skin:    Findings: No erythema or rash.     Nails: There is no clubbing.  Neurological:     Mental Status: She is alert.     Diagnostics:    Spirometry was performed and demonstrated an FEV1 of 1.06 at 46 % of predicted.  Assessment and Plan:  1. Not well controlled severe persistent asthma   2. Perennial allergic rhinitis   3. LPRD (laryngopharyngeal reflux disease)   4. Other eosinophilia     1.  Treat and prevent inflammation:   A. Breo 200 - 1 inhalations 1 times per day (empty lungs)  B. Prednisone 10 mg - 1 tablet 1 time per day for 10 days only  2.  Treat and prevent reflux/LPR:   A. Omeprazole 40 mg - 1 tablet 2 times per day  B. Famotidine 40 mg - 1 tablet in evening  3. Treat and prevent diarrhea:   A. Colestipol 1 gm - 1 tablet 1-2 times per day  4.  If needed:   A. Cetirizine 10 mg - 1 tablet 1-2 times per day  B. Mucinex DM - 1-2 tablets 1-2 times per day  C. Albuterol HFA - 2 inhalations every 4-6 hours  4.  Return to clinic in 4 weeks or earlier if problem  Colinda still appears to have some issues with inflammation of her airway in the context of her pre-existing eosinophilia and we will treat her with a combination inhaler and give her a short course of systemic steroids.  I would like for her to remain on her therapy for LPR which does appear to have helped her somewhat.  She has postprandial diarrhea and we will try her on some colestipol.  I will see her back in this clinic in 4 weeks and will make a determination about further evaluation and treatment for her issues pending her response to the plan noted above.  Allena Katz, MD [facility] / [facility]

## 2021-09-11 ENCOUNTER — Encounter: Payer: Self-pay | Admitting: Allergy and Immunology

## 2021-09-17 ENCOUNTER — Ambulatory Visit (INDEPENDENT_AMBULATORY_CARE_PROVIDER_SITE_OTHER): Payer: Medicare Other | Admitting: Cardiovascular Disease

## 2021-09-17 ENCOUNTER — Encounter: Payer: Self-pay | Admitting: Cardiovascular Disease

## 2021-09-17 VITALS — BP 136/70 | HR 60 | Ht 65.5 in | Wt 189.2 lb

## 2021-09-17 DIAGNOSIS — I4891 Unspecified atrial fibrillation: Secondary | ICD-10-CM | POA: Diagnosis not present

## 2021-09-17 DIAGNOSIS — I340 Nonrheumatic mitral (valve) insufficiency: Secondary | ICD-10-CM | POA: Diagnosis not present

## 2021-09-17 DIAGNOSIS — R609 Edema, unspecified: Secondary | ICD-10-CM

## 2021-09-17 DIAGNOSIS — I519 Heart disease, unspecified: Secondary | ICD-10-CM

## 2021-09-17 MED ORDER — FUROSEMIDE 20 MG PO TABS: 20.0000 mg | ORAL_TABLET | ORAL | 6 refills | Status: DC | PRN

## 2021-09-17 NOTE — Patient Instructions (Signed)
Medication Instructions:  Your physician has recommended you make the following change in your medication:  1- TAKE Lasix 20 mg by mouth daily as needed for edema  *If you need a refill on your cardiac medications before your next appointment, please call your pharmacy*   Lab Work: Your physician recommends that you have lab work today- BMET and BNP  If you have labs (blood work) drawn today and your tests are completely normal, you will receive your results only by: [employer] (if you have [place]) OR A paper copy in the mail If you have any lab test that is abnormal or we need to change your treatment, we will call you to review the results.  Follow-Up: At [facility], you and your health needs are our priority.  As part of our continuing mission to provide you with exceptional heart care, we have created designated Provider Care Teams.  These Care Teams include your primary Cardiologist (physician) and Advanced Practice Providers (APPs -  Physician Assistants and Nurse Practitioners) who all work together to provide you with the care you need, when you need it.  We recommend signing up for the patient portal called "MyChart".  Sign up information is provided on this After Visit Summary.  MyChart is used to connect with patients for Virtual Visits (Telemedicine).  Patients are able to view lab/test results, encounter notes, upcoming appointments, etc.  Non-urgent messages can be sent to your provider as well.   To learn more about what you can do with MyChart, go to [email or web address].    Your next appointment:   1 year(s)  The format for your next appointment:   In Person  Provider:   Jenkins Rouge, MD {    Important Information About Sugar

## 2021-09-18 LAB — BASIC METABOLIC PANEL
BUN/Creatinine Ratio: 25 (ref 12–28)
BUN: 20 mg/dL (ref 8–27)
CO2: 23 mmol/L (ref 20–29)
Calcium: 8.9 mg/dL (ref 8.7–10.3)
Chloride: 104 mmol/L (ref 96–106)
Creatinine, Ser: 0.81 mg/dL (ref 0.57–1.00)
Glucose: 88 mg/dL (ref 70–99)
Potassium: 4.2 mmol/L (ref 3.5–5.2)
Sodium: 141 mmol/L (ref 134–144)
eGFR: 78 mL/min/{1.73_m2} (ref >59)

## 2021-09-18 LAB — PRO B NATRIURETIC PEPTIDE: NT-Pro BNP: 875 pg/mL — ABNORMAL HIGH (ref 0–301)

## 2021-09-19 ENCOUNTER — Telehealth: Payer: Self-pay | Admitting: Cardiovascular Disease

## 2021-09-19 DIAGNOSIS — R609 Edema, unspecified: Secondary | ICD-10-CM

## 2021-09-19 NOTE — Telephone Encounter (Signed)
Pt is returning call to get lab results. Requesting a call back.

## 2021-09-19 NOTE — Telephone Encounter (Signed)
Per Dr. Johnsie Cancel, BNP better than 2 weeks ago but still up  Can take lasix daily and f/u BMET/BNP in a month.  Left message for patient to call back.

## 2021-09-26 MED ORDER — FUROSEMIDE 20 MG PO TABS: 20.0000 mg | ORAL_TABLET | Freq: Every day | ORAL | 6 refills | Status: DC

## 2021-09-26 NOTE — Telephone Encounter (Signed)
Patient returning call.

## 2021-09-26 NOTE — Telephone Encounter (Signed)
Patient aware of results. Patient will get lab work checked again on 10/28/21.

## 2021-09-26 NOTE — Addendum Note (Signed)
Addended by: Aris Georgia, Robson Trickey L on: 09/26/2021 03:25 PM   Modules accepted: Orders

## 2021-10-08 ENCOUNTER — Other Ambulatory Visit: Payer: Self-pay | Admitting: Allergy and Immunology

## 2021-10-28 LAB — BASIC METABOLIC PANEL
BUN/Creatinine Ratio: 22 (ref 12–28)
BUN: 19 mg/dL (ref 8–27)
CO2: 25 mmol/L (ref 20–29)
Calcium: 9.1 mg/dL (ref 8.7–10.3)
Chloride: 102 mmol/L (ref 96–106)
Creatinine, Ser: 0.86 mg/dL (ref 0.57–1.00)
Glucose: 100 mg/dL — ABNORMAL HIGH (ref 70–99)
Potassium: 4.1 mmol/L (ref 3.5–5.2)
Sodium: 145 mmol/L — ABNORMAL HIGH (ref 134–144)
eGFR: 72 mL/min/{1.73_m2} (ref >59)

## 2021-10-28 LAB — PRO B NATRIURETIC PEPTIDE: NT-Pro BNP: 1046 pg/mL — ABNORMAL HIGH (ref 0–301)

## 2022-01-14 ENCOUNTER — Encounter: Payer: Self-pay | Admitting: Orthopaedic Surgery

## 2022-01-14 ENCOUNTER — Ambulatory Visit: Payer: Medicare Other | Admitting: Orthopaedic Surgery

## 2022-01-14 ENCOUNTER — Ambulatory Visit (INDEPENDENT_AMBULATORY_CARE_PROVIDER_SITE_OTHER): Payer: Medicare Other

## 2022-01-14 ENCOUNTER — Ambulatory Visit (INDEPENDENT_AMBULATORY_CARE_PROVIDER_SITE_OTHER): Payer: Medicare Other | Admitting: Orthopaedic Surgery

## 2022-01-14 DIAGNOSIS — G8929 Other chronic pain: Secondary | ICD-10-CM

## 2022-01-14 DIAGNOSIS — M25562 Pain in left knee: Secondary | ICD-10-CM

## 2022-01-14 NOTE — Progress Notes (Signed)
Office Visit Note   Patient: Andrea Harding           Date of Birth: 01-Jun-1950           MRN: [id number] Visit Date: 01/14/2022              Requested by: Georganna Skeans, PA-C [address] [place],  [place] [address] PCP: Georganna Skeans, PA-C   Assessment & Plan: Visit Diagnoses:  1. Chronic pain of left knee     Plan:  Offered her aspiration injection.  She defers.  She would like to try some exercises on her own therefore she was given quad strengthening exercises performed.  She will follow-up with [name] on an as-needed basis pain persist or becomes worse.  Questions were encouraged and answered by [name] and myself.  Did discuss mechanical symptoms that would bring her back to the office.  Follow-Up Instructions: Return if symptoms worsen or fail to improve.   Orders:  Orders Placed This Encounter  Procedures   XR Knee 1-2 Views Left   No orders of the defined types were placed in this encounter.     Procedures: No procedures performed   Clinical Data: No additional findings.   Subjective: Chief Complaint  Patient presents with   Left Knee - Pain    HPI Patient is 71 year old female well-known to Dr. Delilah Harding service comes in today left knee pain status post fall approximately 3 months ago.  She ranks her pain to be 7 out of 10 pain at worst.  She denies any mechanical symptoms.  She takes Tylenol for pain she is on chronic Coumadin and cannot take NSAIDs.  She notes some swelling about the knee.  No prior knee pain. Review of Systems Negative for fevers or chills.  Objective: Vital Signs: There were no vitals taken for this visit.  Physical Exam Constitutional:      Appearance: She is not ill-appearing or diaphoretic.  Pulmonary:     Effort: Pulmonary effort is normal.  Neurological:     Mental Status: She is alert and oriented to person, place, and time.  Psychiatric:        Mood and Affect: Mood normal.     Ortho Exam Bilateral knees good range of  motion.  Mild knees.  No instability valgus varus stressing of either knee.  Tenderness along medial lateral joint line of the left knee only.  McMurray's is negative bilaterally.  Slight effusion left knee. Specialty Comments:  No specialty comments available.  Imaging: XR Knee 1-2 Views Left  Result Date: 01/14/2022 Left knee 2 views: No acute fracture.  Knee is well located.  Mild chondral calcinosis of the lateral meniscus.  Knee overall well-preserved.  No bony abnormalities otherwise.    PMFS History: Patient Active Problem List   Diagnosis Date Noted   Cellulitis of left lower extremity 09/07/2019   Asthma 08/28/2019   Atrophic vaginitis 08/28/2019   Vitamin D deficiency 08/28/2019   Mixed hyperlipidemia 08/15/2019   Acquired hypothyroidism 08/14/2019   Essential hypertension 08/14/2019   Generalized anxiety disorder 08/14/2019   Heart failure ([place]) 08/14/2019   Paroxysmal A-fib ([place]) 08/14/2019   Prediabetes 08/14/2019   Acquired dysmorphic toenail 10/13/2018   Past Medical History:  Diagnosis Date   A-fib [facility])    Acquired autoimmune hypothyroidism    Anxiety    Asthma    Asthma with bronchitis and status asthmaticus    Blood sugar increased    BMI 26.0-26.9,adult  Borderline hyperlipidemia    Chronic reflux esophagitis    Congestive heart failure (CHF) (HCC)    Dark stools    Depression    Drug therapy    Hypertension    Hypothyroidism    Leg swelling    Neoplasm of uncertain behavior of skin    Pemphigoid    Skin cancer    Vaginal dryness    Vaginitis, atrophic    Varicella vaccination    Vitamin D deficiency     Family History  Problem Relation Age of Onset   Heart attack Mother    Diabetes Mother    Thyroid disease Mother    Hypertension Father    Heart disease Father    CVA Father     Past Surgical History:  Procedure Laterality Date   ABDOMINAL HYSTERECTOMY  1982   ANGLE FOOT Right    2009   [place]    SQUAMOUS CELL CARCINOMA EXCISION Right    07/24/15   TONSILLECTOMY  1961   Social History   Occupational History   Not on file  Tobacco Use   Smoking status: Never   Smokeless tobacco: Never  Vaping Use   Vaping Use: Never used  Substance and Sexual Activity   Alcohol use: Never   Drug use: Never   Sexual activity: Not on file    Comment: MARRIED

## 2022-02-04 ENCOUNTER — Other Ambulatory Visit: Payer: Self-pay | Admitting: Allergy and Immunology

## 2022-02-04 NOTE — Telephone Encounter (Signed)
Last fill needs appt

## 2022-02-09 ENCOUNTER — Ambulatory Visit: Payer: Medicare Other | Admitting: Orthopaedic Surgery

## 2022-03-25 ENCOUNTER — Ambulatory Visit (INDEPENDENT_AMBULATORY_CARE_PROVIDER_SITE_OTHER): Payer: Medicare Other | Admitting: Orthopaedic Surgery

## 2022-03-25 DIAGNOSIS — M25462 Effusion, left knee: Secondary | ICD-10-CM

## 2022-03-25 MED ORDER — LIDOCAINE HCL 1 % IJ SOLN
3.0000 mL | INTRAMUSCULAR | Status: AC | PRN
  Administered 2022-03-25: 3 mL

## 2022-03-25 MED ORDER — METHYLPREDNISOLONE ACETATE 40 MG/ML IJ SUSP
40.0000 mg | INTRAMUSCULAR | Status: AC | PRN
  Administered 2022-03-25: 40 mg via INTRA_ARTICULAR

## 2022-03-25 NOTE — Progress Notes (Signed)
The patient is a 72 year old female who comes in today with continued chronic left knee pain but she has had an acute component of the knee pain recently when 64-year-old child who is 80 pounds jumped on her knee when she was on the couch.  We have seen her for the past for her knee and x-rays showed a well-maintained joint space.  The knee is now worsening in terms of pain and swelling.  Examination of her left knee comparing left and right knees does show more edema in the left leg than the right leg.  There are some of this is around the knee joint itself it seems.  I did place an 18-gauge needle in the after cleaning with alcohol swabs and providing a local anesthetic with lidocaine.  There was no effusion that I encountered.  I then placed a steroid injection in her knee.  At this point a MRI is warranted of her left knee to rule out a stress fracture and even a meniscal tear as well as assess the cartilage given her ongoing pain and the failure conservative treatment for over 6 months.  We will see her back once we have this MRI.  All question concerns were answered and addressed.      Procedure Note  Patient: Andrea Harding             Date of Birth: April 04, 1950           MRN: [id number]             Visit Date: 03/25/2022  Procedures: Visit Diagnoses:  1. Effusion, left knee     Large Joint Inj: L knee on 03/25/2022 3:30 PM Indications: diagnostic evaluation and pain Details: 22 G 1.5 in needle, superolateral approach  Arthrogram: No  Medications: 3 mL lidocaine 1 %; 40 mg methylPREDNISolone acetate 40 MG/ML Outcome: tolerated well, no immediate complications Procedure, treatment alternatives, risks and benefits explained, specific risks discussed. Consent was given by the patient. Immediately prior to procedure a time out was called to verify the correct patient, procedure, equipment, support staff and site/side marked as required. Patient was prepped and draped in the usual  sterile fashion.

## 2022-04-07 ENCOUNTER — Other Ambulatory Visit: Payer: Self-pay | Admitting: Allergy and Immunology

## 2022-05-12 ENCOUNTER — Telehealth: Payer: Self-pay | Admitting: Orthopaedic Surgery

## 2022-05-12 ENCOUNTER — Other Ambulatory Visit: Payer: Self-pay

## 2022-05-12 DIAGNOSIS — G8929 Other chronic pain: Secondary | ICD-10-CM

## 2022-05-12 DIAGNOSIS — M25462 Effusion, left knee: Secondary | ICD-10-CM

## 2022-05-12 NOTE — Telephone Encounter (Signed)
Patient states she is waiting on a appointment for an MRI for her knee

## 2022-05-12 NOTE — Telephone Encounter (Signed)
Changed order has stat

## 2022-05-18 ENCOUNTER — Telehealth: Payer: Self-pay | Admitting: Orthopaedic Surgery

## 2022-05-18 NOTE — Telephone Encounter (Signed)
Called pt 1X and left vm for pt to call and set MRI review appt with Dr Ninfa Linden after 05/19/22

## 2022-05-19 ENCOUNTER — Other Ambulatory Visit: Payer: Self-pay | Admitting: Cardiovascular Disease

## 2022-05-19 ENCOUNTER — Ambulatory Visit
Admission: RE | Admit: 2022-05-19 | Discharge: 2022-05-19 | Disposition: A | Discharge status: Home / Self Care | Payer: Medicare Other | Source: Ambulatory Visit | Attending: Orthopaedic Surgery | Admitting: Orthopaedic Surgery

## 2022-05-19 DIAGNOSIS — G8929 Other chronic pain: Secondary | ICD-10-CM

## 2022-05-19 DIAGNOSIS — M25462 Effusion, left knee: Secondary | ICD-10-CM

## 2022-06-01 ENCOUNTER — Other Ambulatory Visit: Payer: Self-pay | Admitting: Allergy and Immunology

## 2022-06-10 ENCOUNTER — Ambulatory Visit (INDEPENDENT_AMBULATORY_CARE_PROVIDER_SITE_OTHER): Payer: Medicare Other | Admitting: Orthopaedic Surgery

## 2022-06-10 ENCOUNTER — Encounter: Payer: Self-pay | Admitting: Orthopaedic Surgery

## 2022-06-10 DIAGNOSIS — G8929 Other chronic pain: Secondary | ICD-10-CM

## 2022-06-10 DIAGNOSIS — M25562 Pain in left knee: Secondary | ICD-10-CM

## 2022-06-10 DIAGNOSIS — M25462 Effusion, left knee: Secondary | ICD-10-CM | POA: Diagnosis not present

## 2022-06-10 NOTE — Progress Notes (Signed)
The patient comes in today to go over an MRI of her left knee.  She is 72 years old and had an acute injury to the left knee several months ago when the child bumped into her and landed on her knee awkwardly.  She had severe pain after then and a steroid injection did help things calm down.  She is feeling much better now but it was appropriate to obtain an MRI of her left knee.  I did review the MRI with her and it did show a small fracture of the fibular head on that left knee laterally with some strain of the lateral collateral ligament and the IT band.  There is also a horizontal meniscal tear that did involve the posterior root of the meniscus with edema there showing that this was an acute injury.  The ACL and PCL were intact.  The lateral meniscus is intact.  The medial collateral ligament and lateral collateral ligament are grossly intact.  She does have thinning of the articular cartilage on the medial aspect of her knee.  On exam of her left knee today, she has good range of motion of the knee and it feels ligamentously stable.  She does not have significant pain.  As of now we will watch her knee clinically since she is doing well and if she does develop worsening pain my next step would be considered arthroscopy for the knee but only if she develops enough pain with mechanical symptoms.  She understands that as well.  All questions and concerns were addressed and answered.  For now follow-up is as needed.

## 2022-06-30 ENCOUNTER — Other Ambulatory Visit: Payer: Self-pay | Admitting: Allergy and Immunology

## 2022-07-29 ENCOUNTER — Other Ambulatory Visit: Payer: Self-pay | Admitting: Allergy and Immunology

## 2022-09-03 NOTE — Progress Notes (Signed)
Cardiology Office Note   Date:  09/17/2022   ID:  Andrea Harding, DOB [birth year], MRN [id number]  PCP:  Verdis Prime, PA-C  Cardiologist:   Charlton Haws, MD   No chief complaint on file.     History of Present Illness: Andrea Harding is a 72 y.o. female f/u chronic afib and HTN  TTE reviewed from 05/07/21 EF 60-65% mild MR mild AR TTE reviewed from 05/02/20 EF 55-60% improved mild/mod MR mild AR  TTE reviewed from 05/01/19 EF 40-45% global mild MR/AR LAE 41 mm  Wanted to start Entresto but patient preferred ARB    On Toprol and coumadin   She is asymptomatic No chest pain , fatigue palpitations or dyspnea.    PA in [place] checks INR every 4 weeks no bleeding issues   Daughter / son in law died and they are trying to care for 4 of their young kids Neither she or husband will get vaccine   Discussed DOAC multiple times she prefers to stay on coumadin  She has some dependant edema and uses PRN Lasix  Sees [facility] and had small fracture of fibular head left leg post steroid injection with conservative Rx planned   No cardiac issues Still wants to be on coumadin No bleeding issues She inquired about [facility] but she has no bleeding risk and has done well with anticoagulation and I did not encourage it    Past Medical History:  Diagnosis Date   A-fib (HCC)    Acquired autoimmune hypothyroidism    Anxiety    Asthma    Asthma with bronchitis and status asthmaticus    Blood sugar increased    BMI 26.0-26.9,adult    Borderline hyperlipidemia    Chronic reflux esophagitis    Congestive heart failure (CHF) (HCC)    Dark stools    Depression    Drug therapy    Hypertension    Hypothyroidism    Leg swelling    Neoplasm of uncertain behavior of skin    Pemphigoid    Skin cancer    Vaginal dryness    Vaginitis, atrophic    Varicella vaccination    Vitamin D deficiency     Past Surgical History:  Procedure Laterality Date   ABDOMINAL HYSTERECTOMY  1982   ANGLE  FOOT Right    2009   CESAREAN SECTION     HERNIA REPAIR  1974   SQUAMOUS CELL CARCINOMA EXCISION Right    07/24/15   TONSILLECTOMY  1961     Current Outpatient Medications  Medication Sig Dispense Refill   ADVAIR DISKUS 250-50 MCG/ACT AEPB Inhale one dose twice daily to prevent cough or wheeze.  Rinse, gargle, and spit after use. 60 each 5   amitriptyline (ELAVIL) 10 MG tablet Take 10 mg by mouth at bedtime.     Biotin w/ Vitamins C & E (HAIR/SKIN/NAILS PO) Take by mouth.     CALCIUM PO Take by mouth.     citalopram (CELEXA) 40 MG tablet Take 40 mg by mouth every morning.     clonazePAM (KLONOPIN) 0.5 MG tablet Take 1 tablet by mouth 2 (two) times daily as needed.     colestipol (COLESTID) 1 g tablet Can take one tablet one to two times daily as directed. 60 tablet 5   cyclobenzaprine (FLEXERIL) 10 MG tablet Take by mouth.     famotidine (PEPCID) 40 MG tablet Take 1 tablet by mouth at bedtime. 90 tablet 0   furosemide (  LASIX) 20 MG tablet Take 1 tablet (20 mg total) by mouth daily. Please schedule appt for future refills. 1st attempt 90 tablet 0   levothyroxine (SYNTHROID, LEVOTHROID) 175 MCG tablet TAKE 1/2 TABLET BY MOUTH DAILY     losartan (COZAAR) 25 MG tablet Take 25 mg by mouth daily.     MELATONIN PO Take by mouth.     metoprolol succinate (TOPROL-XL) 25 MG 24 hr tablet Take 1 tablet by mouth daily.     Multiple Vitamin (MULTIVITAMIN PO) Take by mouth.     OLANZapine (ZYPREXA) 5 MG tablet Take 5 mg by mouth at bedtime.     omeprazole (PRILOSEC) 40 MG capsule Take one capsule by mouth twice daily as directed. 60 capsule 5   VENTOLIN HFA 108 (90 Base) MCG/ACT inhaler Inhale 2 puffs into the lungs every 4 (four) hours as needed.     VITAMIN D PO Take by mouth.     warfarin (COUMADIN) 5 MG tablet Take 1 tablet by mouth as directed. Take 1 tablet by mouth once a day except Fridays take 1.5 tablets     No current facility-administered medications for this visit.    Allergies:    Amoxicillin-pot clavulanate, Amoxicillin, Cephalexin, Other, Penicillins, and Sulfa antibiotics    Social History:  The patient  reports that she has never smoked. She has never used smokeless tobacco. She reports that she does not drink alcohol and does not use drugs.   Family History:  The patient's family history includes CVA in her father; Diabetes in her mother; Heart attack in her mother; Heart disease in her father; Hypertension in her father; Thyroid disease in her mother.    ROS:  Please see the history of present illness.   Otherwise, review of systems are positive for none.   All other systems are reviewed and negative.    PHYSICAL EXAM: VS:  BP 102/68   Pulse 76   Ht 5\' 8"  (1.727 m)   Wt 175 lb (79.4 kg)   SpO2 96%   BMI 26.61 kg/m  , BMI Body mass index is 26.61 kg/m. Affect appropriate Healthy:  appears stated age HEENT: normal Neck supple with no adenopathy JVP normal no bruits no thyromegaly Lungs clear with no wheezing and good diaphragmatic motion Heart:  S1/S2 no murmur, no rub, gallop or click PMI normal Abdomen: benighn, BS positve, no tenderness, no AAA no bruit.  No HSM or HJR Distal pulses intact with no bruits No edema Neuro non-focal Skin warm and dry No muscular weakness   EKG:   05/01/19 afib rate 72 nonspecific ST changes 09/17/2022 afib  rate 79 normal    Recent Labs: 10/27/2021: BUN 19; Creatinine, Ser 0.86; NT-Pro BNP 1,046; Potassium 4.1; Sodium 145    Lipid Panel No results found for: "CHOL", "TRIG", "HDL", "CHOLHDL", "VLDL", "LDLCALC", "LDLDIRECT"    Wt Readings from Last 3 Encounters:  09/17/22 175 lb (79.4 kg)  09/17/21 189 lb 4 oz (85.8 kg)  09/10/21 191 lb 3.2 oz (86.7 kg)     Other studies Reviewed: Additional studies/ records that were reviewed today include: Notes from [facility] labs, ECG .Echo 05/01/19   ASSESSMENT AND PLAN:  1.  Afib: Previous discussion regarding  rate control and anticoagulation vs conversion strategy  Being asymptomatic decided on rate control and anticoagulation Currently on coumadin Discussed DOAC and she prefers to stay on coumadin INR been Rx most recently 08/13/22   2. Thyroid:  TSH normal continue synthroid replacement  3.  HTN:  Well controlled.  Continue current medications and low sodium Dash type diet.    4. Depression: continue Celexa mood seems appropriate  5. Mild Systolic Dysfunction :  normalized on most recent echo 05/07/21 with only stable mild AR/MR Continue ARB, Toprol and lasix  6. Edema:  non on exam today doubt CHF see below regarding labs and PRN lasix Continue compression stockings and low sodium diet   7. Ortho:  f/u [facility] for left fibular fracture and knee pain Conservative plan to date   Current medicines are reviewed at length with the patient today.  The patient does not have concerns regarding medicines.  The following changes have been made:  None   Labs/ tests ordered today include :primary to check cell counts and BMET next month   Orders Placed This Encounter  Procedures   EKG 12-Lead     Disposition:   FU with me in a year       Signed, Charlton Haws, MD  09/17/2022 2:38 PM    [facility] [address] [place], [place], [place]  [address] Phone: [phone]; Fax: [phone]

## 2022-09-17 ENCOUNTER — Encounter: Payer: Self-pay | Admitting: Cardiovascular Disease

## 2022-09-17 ENCOUNTER — Ambulatory Visit: Payer: Medicare Other | Attending: Cardiovascular Disease | Admitting: Cardiovascular Disease

## 2022-09-17 VITALS — BP 102/68 | HR 76 | Ht 68.0 in | Wt 175.0 lb

## 2022-09-17 DIAGNOSIS — I519 Heart disease, unspecified: Secondary | ICD-10-CM | POA: Diagnosis not present

## 2022-09-17 DIAGNOSIS — I4891 Unspecified atrial fibrillation: Secondary | ICD-10-CM | POA: Diagnosis not present

## 2022-09-17 NOTE — Patient Instructions (Signed)

## 2022-10-22 ENCOUNTER — Other Ambulatory Visit: Payer: Self-pay | Admitting: Allergy and Immunology

## 2022-10-27 ENCOUNTER — Other Ambulatory Visit: Payer: Self-pay | Admitting: Allergy and Immunology

## 2022-11-05 ENCOUNTER — Other Ambulatory Visit: Payer: Self-pay | Admitting: Allergy and Immunology

## 2024-02-15 ENCOUNTER — Encounter: Payer: Self-pay | Admitting: *Deleted

## 2024-02-15 ENCOUNTER — Telehealth: Payer: Self-pay | Admitting: *Deleted

## 2024-02-15 DIAGNOSIS — D0511 Intraductal carcinoma in situ of right breast: Secondary | ICD-10-CM | POA: Insufficient documentation

## 2024-02-15 NOTE — Progress Notes (Signed)
 Radiation Oncology         [phone] ________________________________  Name: Andrea Harding        MRN: [id number]  Date of Service: 02/16/2024 DOB: 15-May-1950  CC:Liu, Xiao F, PA-C (Inactive)  Vanderbilt [name], MD     REFERRING PHYSICIAN: Vanderbilt [name], MD   DIAGNOSIS: There were no encounter diagnoses. @icd10 @   HISTORY OF PRESENT ILLNESS: Andrea Harding is a 73 y.o. female seen in consultation for radiation therapy.  Estrogen exposure history:  Childbearing/breastfeeding:   Family history of cancer:  PREVIOUS RADIATION THERAPY: {EXAM; YES/NO:19492::No}  AUTOIMMUNE DISEASE: {EXAM; YES/NO:19492::No}  MEDICAL DEVICES: {EXAM; YES/NO:19492::No}  PREGNANCY: {EXAM; YES/NO:19492::No}   PAST MEDICAL HISTORY:  Past Medical History:  Diagnosis Date   A-fib (HCC)    Acquired autoimmune hypothyroidism    Anxiety    Asthma    Asthma with bronchitis and status asthmaticus    Blood sugar increased    BMI 26.0-26.9,adult    Borderline hyperlipidemia    Chronic reflux esophagitis    Congestive heart failure (CHF) (HCC)    Dark stools    Depression    Drug therapy    Hypertension    Hypothyroidism    Leg swelling    Neoplasm of uncertain behavior of skin    Pemphigoid    Skin cancer    Vaginal dryness    Vaginitis, atrophic    Varicella vaccination    Vitamin D deficiency        PAST SURGICAL HISTORY: Past Surgical History:  Procedure Laterality Date   ABDOMINAL HYSTERECTOMY  1982   ANGLE FOOT Right    2009   CESAREAN SECTION     HERNIA REPAIR  1974   SQUAMOUS CELL CARCINOMA EXCISION Right    07/24/15   TONSILLECTOMY  1961     FAMILY HISTORY:  Family History  Problem Relation Age of Onset   Heart attack Mother    Diabetes Mother    Thyroid  disease Mother    Hypertension Father    Heart disease Father    CVA Father      SOCIAL HISTORY:  reports that she has never smoked. She has never used smokeless tobacco. She reports that she  does not drink alcohol and does not use drugs.   ALLERGIES: Amoxicillin-pot clavulanate, Amoxicillin, Cephalexin, Other, Penicillins, and Sulfa antibiotics   MEDICATIONS:  Current Outpatient Medications  Medication Sig Dispense Refill   ADVAIR  DISKUS 250-50 MCG/ACT AEPB Inhale one dose twice daily to prevent cough or wheeze.  Rinse, gargle, and spit after use. 60 each 5   amitriptyline (ELAVIL) 10 MG tablet Take 10 mg by mouth at bedtime.     Biotin w/ Vitamins C & E (HAIR/SKIN/NAILS PO) Take by mouth.     CALCIUM PO Take by mouth.     citalopram (CELEXA) 40 MG tablet Take 40 mg by mouth every morning.     clonazePAM (KLONOPIN) 0.5 MG tablet Take 1 tablet by mouth 2 (two) times daily as needed.     colestipol  (COLESTID ) 1 g tablet Can take one tablet one to two times daily as directed. 60 tablet 5   cyclobenzaprine (FLEXERIL) 10 MG tablet Take by mouth.     famotidine  (PEPCID ) 40 MG tablet Take 1 tablet by mouth at bedtime. 90 tablet 0   furosemide  (LASIX ) 20 MG tablet Take 1 tablet (20 mg total) by mouth daily. Please schedule appt for future refills. 1st attempt 90 tablet 0   levothyroxine (SYNTHROID, LEVOTHROID) 175  MCG tablet TAKE 1/2 TABLET BY MOUTH DAILY     losartan  (COZAAR ) 25 MG tablet Take 25 mg by mouth daily.     MELATONIN PO Take by mouth.     metoprolol succinate (TOPROL-XL) 25 MG 24 hr tablet Take 1 tablet by mouth daily.     Multiple Vitamin (MULTIVITAMIN PO) Take by mouth.     OLANZapine (ZYPREXA) 5 MG tablet Take 5 mg by mouth at bedtime.     omeprazole  (PRILOSEC) 40 MG capsule Take one capsule by mouth twice daily as directed. 60 capsule 5   VENTOLIN HFA 108 (90 Base) MCG/ACT inhaler Inhale 2 puffs into the lungs every 4 (four) hours as needed.     VITAMIN D PO Take by mouth.     warfarin (COUMADIN) 5 MG tablet Take 1 tablet by mouth as directed. Take 1 tablet by mouth once a day except Fridays take 1.5 tablets     No current facility-administered medications for this  visit.     REVIEW OF SYSTEMS: The patient reports that s***/he is doing well overall and a review of symptoms is otherwise negative.      PHYSICAL EXAM:  Wt Readings from Last 3 Encounters:  09/17/22 175 lb (79.4 kg)  09/17/21 189 lb 4 oz (85.8 kg)  09/10/21 191 lb 3.2 oz (86.7 kg)   Temp Readings from Last 3 Encounters:  No data found for Temp   BP Readings from Last 3 Encounters:  09/17/22 102/68  09/17/21 136/70  09/10/21 138/72   Pulse Readings from Last 3 Encounters:  09/17/22 76  09/17/21 60  09/10/21 72    /10   Physical Exam   Breast Exam:  Well-healed lumpectomy/re-excision scar in the *** quadrant. No erythema, induration, or drainage. No palpable masses or nodularity at the surgical site. No skin changes, nipple inversion, or discharge.   ECOG = ***   LABORATORY DATA:  Lab Results  Component Value Date   WBC 7.2 05/06/2007   HGB 11.3 (L) 05/06/2007   HCT 32.6 (L) 05/06/2007   MCV 88.5 05/06/2007   PLT 191 05/06/2007   Lab Results  Component Value Date   NA 145 (H) 10/27/2021   K 4.1 10/27/2021   CL 102 10/27/2021   CO2 25 10/27/2021   No results found for: ALT, AST, GGT, ALKPHOS, BILITOT    RADIOGRAPHY: No results found.   PATHOLOGY:     IMPRESSION/PLAN:   Patient with *** who is seen pre/post-operatively *** for consideration of adjuvant radiation therapy. We discussed the role of breast irradiation in reducing the risk of local recurrence and improving long-term disease control.  We reviewed the logistics of treatment in detail, including the need for a CT simulation for treatment planning, followed by several days for contouring, dosimetry, and quality assurance prior to starting therapy.   The planned course of radiation therapy will consist of daily treatments, Monday through Friday, over approximately 1-3 weeks. The decision between a moderately hypofractionated regimen and an ultrahypofractionated regimen will be made  based on patient preference after discussion of the risks, benefits, and available data. It was explained that longer-term outcome data are available for moderate hypofractionation; however, based on current evidence, both approaches appear comparable in terms of disease control and toxicity profiles.  We reviewed that each treatment session typically lasts only a few minutes, though positioning and setup may take additional time. The patient should plan to be in the department for approximately 45 minutes per visit. She will  be evaluated by me at least once weekly during treatment to monitor for side effects, assess tolerance, and ensure it is safe to continue therapy.  We discussed acute and subacute side effects which are generally gradual in onset and typically include fatigue, skin erythema, tanning or hyperpigmentation, breast edema or firmness and mild tenderness.  Less common but possible side effects include desquamation of the skin, decreased range of motion of the shoulder, and transient changes in breast size and texture.  Long-term risk such as cosmetic changes, rare risk of rib fracture and cardiopulmonary effects were also reviewed.  Reassured patient that most acute side effects improve over weeks to months after completing therapy.   Patient verbalized understanding of these risks and benefits and she is in agreement with the plan. I will see her in follow-up after surgery. Final treatment planning will depend on her postoperative pathology report, but we anticipate adjuvant whole breast radiation. Do not anticipate the need to radiate her axilla or any nodal regions.   --  Total time spent today in preparation for this visit was *** minutes. This included patient care, imaging and path review, documentation, multidisciplinary discussion and coordination of care and follow up.    Estefana HERO. Maritza, M.D.

## 2024-02-15 NOTE — Telephone Encounter (Signed)
 Spoke to patient to confirm upcoming morning [facility] clinic appointment on 12/17, paperwork will be sent via [facility] location and time, also informed patient that the surgeon's office would be calling as well to get information from them similar to the packet that they will be receiving so make sure to do both.  Reminded patient that all providers will be coming to the clinic to see them HERE and if they had any questions to not hesitate to reach back out to myself or their navigators.

## 2024-02-16 ENCOUNTER — Ambulatory Visit
Admission: RE | Admit: 2024-02-16 | Discharge: 2024-02-16 | Attending: Radiation Oncology | Admitting: Radiation Oncology

## 2024-02-16 ENCOUNTER — Inpatient Hospital Stay

## 2024-02-16 ENCOUNTER — Ambulatory Visit: Admitting: Physical Therapy

## 2024-02-16 ENCOUNTER — Inpatient Hospital Stay: Admitting: Hematology and Oncology

## 2024-02-16 ENCOUNTER — Inpatient Hospital Stay: Attending: Licensed Clinical Social Worker | Admitting: Licensed Clinical Social Worker

## 2024-02-16 ENCOUNTER — Encounter: Payer: Self-pay | Admitting: *Deleted

## 2024-02-16 VITALS — BP 134/67 | HR 68 | Temp 97.2°F | Resp 16 | Ht 68.5 in | Wt 173.2 lb

## 2024-02-16 DIAGNOSIS — Z90710 Acquired absence of both cervix and uterus: Secondary | ICD-10-CM

## 2024-02-16 DIAGNOSIS — Z881 Allergy status to other antibiotic agents status: Secondary | ICD-10-CM | POA: Diagnosis not present

## 2024-02-16 DIAGNOSIS — Z8589 Personal history of malignant neoplasm of other organs and systems: Secondary | ICD-10-CM | POA: Insufficient documentation

## 2024-02-16 DIAGNOSIS — Z79899 Other long term (current) drug therapy: Secondary | ICD-10-CM | POA: Diagnosis not present

## 2024-02-16 DIAGNOSIS — Z880 Allergy status to penicillin: Secondary | ICD-10-CM | POA: Insufficient documentation

## 2024-02-16 DIAGNOSIS — I110 Hypertensive heart disease with heart failure: Secondary | ICD-10-CM | POA: Diagnosis not present

## 2024-02-16 DIAGNOSIS — C44711 Basal cell carcinoma of skin of unspecified lower limb, including hip: Secondary | ICD-10-CM

## 2024-02-16 DIAGNOSIS — Z833 Family history of diabetes mellitus: Secondary | ICD-10-CM

## 2024-02-16 DIAGNOSIS — Z7901 Long term (current) use of anticoagulants: Secondary | ICD-10-CM

## 2024-02-16 DIAGNOSIS — D0511 Intraductal carcinoma in situ of right breast: Secondary | ICD-10-CM

## 2024-02-16 DIAGNOSIS — Z882 Allergy status to sulfonamides status: Secondary | ICD-10-CM

## 2024-02-16 DIAGNOSIS — Z8349 Family history of other endocrine, nutritional and metabolic diseases: Secondary | ICD-10-CM | POA: Insufficient documentation

## 2024-02-16 DIAGNOSIS — Z85828 Personal history of other malignant neoplasm of skin: Secondary | ICD-10-CM | POA: Diagnosis not present

## 2024-02-16 DIAGNOSIS — Z8249 Family history of ischemic heart disease and other diseases of the circulatory system: Secondary | ICD-10-CM | POA: Diagnosis not present

## 2024-02-16 DIAGNOSIS — I4891 Unspecified atrial fibrillation: Secondary | ICD-10-CM | POA: Diagnosis not present

## 2024-02-16 DIAGNOSIS — Z823 Family history of stroke: Secondary | ICD-10-CM

## 2024-02-16 DIAGNOSIS — C4491 Basal cell carcinoma of skin, unspecified: Secondary | ICD-10-CM | POA: Insufficient documentation

## 2024-02-16 LAB — CMP (CANCER CENTER ONLY)
ALT: 28 U/L (ref 0–44)
AST: 28 U/L (ref 15–41)
Albumin: 4.5 g/dL (ref 3.5–5.0)
Alkaline Phosphatase: 89 U/L (ref 38–126)
Anion gap: 12 (ref 5–15)
BUN: 24 mg/dL — ABNORMAL HIGH (ref 8–23)
CO2: 25 mmol/L (ref 22–32)
Calcium: 9.4 mg/dL (ref 8.9–10.3)
Chloride: 101 mmol/L (ref 98–111)
Creatinine: 0.91 mg/dL (ref 0.44–1.00)
GFR, Estimated: >60 mL/min (ref >60)
Glucose, Bld: 84 mg/dL (ref 70–99)
Potassium: 3.5 mmol/L (ref 3.5–5.1)
Sodium: 138 mmol/L (ref 135–145)
Total Bilirubin: 0.6 mg/dL (ref 0.0–1.2)
Total Protein: 7.1 g/dL (ref 6.5–8.1)

## 2024-02-16 LAB — CBC WITH DIFFERENTIAL (CANCER CENTER ONLY)
Abs Immature Granulocytes: 0.03 K/uL (ref 0.00–0.07)
Basophils Absolute: 0.1 K/uL (ref 0.0–0.1)
Basophils Relative: 1 %
Eosinophils Absolute: 0.6 K/uL — ABNORMAL HIGH (ref 0.0–0.5)
Eosinophils Relative: 5 %
HCT: 39.1 % (ref 36.0–46.0)
Hemoglobin: 13.7 g/dL (ref 12.0–15.0)
Immature Granulocytes: 0 %
Lymphocytes Relative: 34 %
Lymphs Abs: 3.9 K/uL (ref 0.7–4.0)
MCH: 31 pg (ref 26.0–34.0)
MCHC: 35 g/dL (ref 30.0–36.0)
MCV: 88.5 fL (ref 80.0–100.0)
Monocytes Absolute: 1.1 K/uL — ABNORMAL HIGH (ref 0.1–1.0)
Monocytes Relative: 9 %
Neutro Abs: 6 K/uL (ref 1.7–7.7)
Neutrophils Relative %: 51 %
Platelet Count: 203 K/uL (ref 150–400)
RBC: 4.42 MIL/uL (ref 3.87–5.11)
RDW: 13.2 % (ref 11.5–15.5)
WBC Count: 11.7 K/uL — ABNORMAL HIGH (ref 4.0–10.5)
nRBC: 0 % (ref 0.0–0.2)

## 2024-02-16 NOTE — Progress Notes (Signed)
 [facility] Clinical Social Work  Initial Assessment   Andrea Harding is a 73 y.o. year old female accompanied by husband, Andrea Harding. Clinical Social Work was referred by breast clinic for assessment of psychosocial needs.   SDOH (Social Determinants of Health) assessments performed: Yes SDOH Interventions    Flowsheet Row Clinical Support from 02/16/2024 in [facility] Med Onc - A Dept Of [place]. [facility]  SDOH Interventions   Food Insecurity Interventions Intervention Not Indicated  Housing Interventions Intervention Not Indicated  Transportation Interventions Intervention Not Indicated  Utilities Interventions Intervention Not Indicated    SDOH Screenings   Food Insecurity: No Food Insecurity (02/16/2024)  Housing: Low Risk (02/16/2024)  Transportation Needs: No Transportation Needs (02/16/2024)  Utilities: Not At Risk (02/16/2024)  Depression (PHQ2-9): Low Risk (02/16/2024)  Social Connections: Unknown (07/15/2021)   Received from [facility]  Stress: No Stress Concern Present (04/23/2021)   Received from [facility]  Tobacco Use: Low Risk  (02/15/2024)   Received from [facility]    PHQ 2/9:    02/16/2024   11:40 AM  Depression screen PHQ 2/9  Decreased Interest 0  Down, Depressed, Hopeless 0  PHQ - 2 Score 0     Distress Screen completed: No     No data to display            Family/Social Information:  Housing Arrangement: patient lives with her husband, Andrea Harding Family members/support persons in your life? Family- husband, sisters, faith Transportation concerns: no  Employment: Retired.  Income source: Actor concerns: No Type of concern: None Food access concerns: no Religious or spiritual practice: Yes-pt's faith is very important to her Advanced directives: Yes-not on file Services Currently in place:  Medicare + supplement  Coping/ Adjustment to diagnosis: Patient understands  treatment plan and what happens next? yes, feels more confident after meeting with the medical team. Concerns about diagnosis and/or treatment: I'm not especially worried about anything Patient reported stressors: pt shared about recent loss of her twin sister as well as the death of her daughter about 3 years ago and resulting complexities with their grandchildren and their care. She is coping well overall though and tries to notice the good Patient enjoys time with family/ friends Current coping skills/ strengths: Ability for insight , Motivation for treatment/growth , Religious Affiliation , and Supportive family/friends     SUMMARY: Current SDOH Barriers:  No major barriers identified today  Clinical Social Work Clinical Goal(s):  No clinical social work goals at this time  Interventions: Discussed common feeling and emotions when being diagnosed with cancer, and the importance of support during treatment Informed patient of the support team roles and support services at [facility] Provided CSW contact information and encouraged patient to call with any questions or concerns    Follow Up Plan: Patient will contact CSW with any support or resource needs Patient verbalizes understanding of plan: Yes    Andrea Harding E Andrea Sparrow, LCSW Clinical Social Worker [facility]

## 2024-02-16 NOTE — Progress Notes (Signed)
 Andrea Harding CONSULT NOTE  Patient Care Team: Liu, Xiao F, PA-C (Inactive) as PCP - General (Family Medicine) [name], MD as PCP - Cardiology (Cardiology) [name], RN as Oncology Nurse Navigator Vanderbilt [name], MD as Consulting Physician (General Surgery) [name], MD as Consulting Physician (Hematology and Oncology) [name], MD as Consulting Physician (Radiation Oncology)  CHIEF COMPLAINTS/PURPOSE OF CONSULTATION:  Newly diagnosed breast cancer  HISTORY OF PRESENTING ILLNESS:  Andrea Harding 73 y.o. female is here because of recent diagnosis of right breast DCIS.  I reviewed her records extensively and collaborated the history with the patient.  SUMMARY OF ONCOLOGIC HISTORY: Oncology History  Ductal carcinoma in situ (DCIS) of right breast  02/09/2024 Mammogram   Patient had screening mammogram which warranted further evaluation.  She had contrast-enhanced mammogram which showed three 1 cm enhancing asymmetries in the right breast at 12:00 which is suspicious and core biopsy was recommended.   02/09/2024 Pathology Results   Right breast needle core biopsy at 12:00 4 cm from nipple showed DCIS low to intermediate grade, solid and cribriform types with apocrine features, no necrosis, no calcifications.  Right breast needle core biopsy at 12:00 5 cm from the nipple once again showed DCIS low to intermediate grade necrosis focally present Not present estrogen 10% positive moderate staining progesterone 0% negative   02/15/2024 Initial Diagnosis   Ductal carcinoma in situ (DCIS) of right breast    Discussed the use of AI scribe software for clinical note transcription with the patient, who gave verbal consent to proceed.  History of Present Illness Andrea Harding is a 74 year old female with stage 0 ductal carcinoma in situ of the breast who presents for oncology consultation regarding management of early-stage breast cancer.  She  reports no breast-related symptoms, including absence of palpable mass, pain, nipple discharge, or skin changes.  She is anticoagulated with warfarin for atrial fibrillation and has a history of congestive heart failure, both managed by her cardiologist and family physician. She has a history of squamous cell carcinoma of the skin, previously treated with Mohs surgery, and another skin lesion that did not require surgical intervention. She reports no other family history of cancer.  Rest of the pertinent 10 point ROS reviewed and neg.  MEDICAL HISTORY:  Past Medical History:  Diagnosis Date   A-fib [facility])    Acquired autoimmune hypothyroidism    Anxiety    Asthma    Asthma with bronchitis and status asthmaticus    Blood sugar increased    BMI 26.0-26.9,adult    Borderline hyperlipidemia    Chronic reflux esophagitis    Congestive heart failure (CHF) (HCC)    Dark stools    Depression    Drug therapy    Hypertension    Hypothyroidism    Leg swelling    Neoplasm of uncertain behavior of skin    Pemphigoid    Skin cancer    Vaginal dryness    Vaginitis, atrophic    Varicella vaccination    Vitamin D deficiency     SURGICAL HISTORY: Past Surgical History:  Procedure Laterality Date   ABDOMINAL HYSTERECTOMY  1982   ANGLE FOOT Right    2009   CESAREAN SECTION     HERNIA REPAIR  1974   SQUAMOUS CELL CARCINOMA EXCISION Right    07/24/15   TONSILLECTOMY  1961    SOCIAL HISTORY: Social History   Socioeconomic History   Marital status: Married    Spouse  name: Not on file   Number of children: Not on file   Years of education: Not on file   Highest education level: Not on file  Occupational History   Not on file  Tobacco Use   Smoking status: Never   Smokeless tobacco: Never  Vaping Use   Vaping status: Never Used  Substance and Sexual Activity   Alcohol use: Never   Drug use: Never   Sexual activity: Not on file    Comment: MARRIED  Other Topics Concern   Not  on file  Social History Narrative   Not on file   Social Drivers of Health   Tobacco Use: Low Risk  (02/15/2024)   [facility]   Patient History    Smoking Tobacco Use: Never    Smokeless Tobacco Use: Never    Passive Exposure: Not on file  Financial Resource Strain: Not on file  Food Insecurity: Low Risk (01/24/2024)   Received from [facility]   Epic    Within the past 12 months, you worried that your food would run out before you got money to buy more: Never true    Within the past 12 months, the food you bought just didn't last and you didn't have money to get more. : Never true  Transportation Needs: No Transportation Needs (01/24/2024)   Received from [facility]    In the past 12 months, has lack of reliable transportation kept you from medical appointments, meetings, work or from getting things needed for daily living? : No  Physical Activity: Not on file  Stress: No Stress Concern Present (04/23/2021)   Received from [facility] of Occupational Health - Occupational Stress Questionnaire    Feeling of Stress : Only a little  Social Connections: Unknown (07/15/2021)   Received from [facility]   Social Network    Social Network: Not on file  Intimate Partner Violence: Unknown (06/06/2021)   Received from [facility]   HITS    Physically Hurt: Not on file    Insult or Talk Down To: Not on file    Threaten Physical Harm: Not on file    Scream or Curse: Not on file  Depression (EYV7-0): Not on file  Alcohol Screen: Not on file  Housing: Low Risk (01/24/2024)   Received from [facility]   Epic    What is your living situation today?: I have a steady place to live    Think about the place you live. Do you have problems with any of the following? Choose all that apply:: None/None on this list  Utilities: Low Risk (01/24/2024)   Received from [facility]   Utilities    In the past 12 months  has the electric, gas, oil, or water company threatened to shut off services in your home? : No  Health Literacy: Not on file    FAMILY HISTORY: Family History  Problem Relation Age of Onset   Heart attack Mother    Diabetes Mother    Thyroid  disease Mother    Hypertension Father    Heart disease Father    CVA Father     ALLERGIES:  is allergic to amoxicillin-pot clavulanate, amoxicillin, cephalexin, other, penicillins, and sulfa antibiotics.  MEDICATIONS:  Current Outpatient Medications  Medication Sig Dispense Refill   ADVAIR  DISKUS 250-50 MCG/ACT AEPB Inhale one dose twice daily to prevent cough or wheeze.  Rinse, gargle, and spit after use. 60  each 5   amitriptyline (ELAVIL) 10 MG tablet Take 10 mg by mouth at bedtime.     Biotin w/ Vitamins C & E (HAIR/SKIN/NAILS PO) Take by mouth.     CALCIUM PO Take by mouth.     citalopram (CELEXA) 40 MG tablet Take 40 mg by mouth every morning.     clonazePAM (KLONOPIN) 0.5 MG tablet Take 1 tablet by mouth 2 (two) times daily as needed.     colestipol  (COLESTID ) 1 g tablet Can take one tablet one to two times daily as directed. 60 tablet 5   cyclobenzaprine (FLEXERIL) 10 MG tablet Take by mouth.     famotidine  (PEPCID ) 40 MG tablet Take 1 tablet by mouth at bedtime. 90 tablet 0   furosemide  (LASIX ) 20 MG tablet Take 1 tablet (20 mg total) by mouth daily. Please schedule appt for future refills. 1st attempt 90 tablet 0   levothyroxine (SYNTHROID, LEVOTHROID) 175 MCG tablet TAKE 1/2 TABLET BY MOUTH DAILY     losartan  (COZAAR ) 25 MG tablet Take 25 mg by mouth daily.     MELATONIN PO Take by mouth.     metoprolol succinate (TOPROL-XL) 25 MG 24 hr tablet Take 1 tablet by mouth daily.     Multiple Vitamin (MULTIVITAMIN PO) Take by mouth.     OLANZapine (ZYPREXA) 5 MG tablet Take 5 mg by mouth at bedtime.     omeprazole  (PRILOSEC) 40 MG capsule Take one capsule by mouth twice daily as directed. 60 capsule 5   VENTOLIN HFA 108 (90 Base) MCG/ACT  inhaler Inhale 2 puffs into the lungs every 4 (four) hours as needed.     VITAMIN D PO Take by mouth.     warfarin (COUMADIN) 5 MG tablet Take 1 tablet by mouth as directed. Take 1 tablet by mouth once a day except Fridays take 1.5 tablets     No current facility-administered medications for this visit.     PHYSICAL EXAMINATION: ECOG PERFORMANCE STATUS: 0 - Asymptomatic  Vitals:   02/16/24 0830  BP: 134/67  Pulse: 68  Resp: 16  Temp: (!) 97.2 F (36.2 C)  SpO2: 99%   Filed Weights   02/16/24 0830  Weight: 173 lb 3.2 oz (78.6 kg)    GENERAL:alert, no distress and comfortable  LABORATORY DATA:  I have reviewed the data as listed Lab Results  Component Value Date   WBC 7.2 05/06/2007   HGB 11.3 (L) 05/06/2007   HCT 32.6 (L) 05/06/2007   MCV 88.5 05/06/2007   PLT 191 05/06/2007   Lab Results  Component Value Date   NA 145 (H) 10/27/2021   K 4.1 10/27/2021   CL 102 10/27/2021   CO2 25 10/27/2021    RADIOGRAPHIC STUDIES: I have personally reviewed the radiological reports and agreed with the findings in the report.  ASSESSMENT AND PLAN:   Assessment and Plan Assessment & Plan Stage 0 ductal carcinoma in situ of the breast Non-invasive ductal carcinoma in situ confined to the milk duct with low estrogen receptor positivity. Chemotherapy not indicated. Anti-estrogen therapy will be considered based on hormone receptor status on the final pathology. Radiation therapy anticipated postoperatively. - Proceed with lumpectomy. - Assess estrogen and progesterone receptor status on excised tissue for adjuvant endocrine therapy need. - Initiate anti-estrogen therapy if significant hormone receptor positivity for five years. - Monitor for additional surgery need if invasive cancer identified on final pathology. - Monitor bone density if anti-estrogen therapy initiated. - Reassess and follow up post-surgery/radiation  to review pathology and finalize management. - Reviewed  role of anti estrogen therapy, discussed mechanism of action, adverse effects with each class    All questions were answered. The patient knows to call the clinic with any problems, questions or concerns.    Amber Stalls, MD 02/16/2024

## 2024-02-16 NOTE — Progress Notes (Addendum)
 REFERRING PROVIDER: Vanderbilt [name], MD [address] [place],  [place] [address]  PRIMARY PROVIDER:  Liu, Xiao F, PA-C (Inactive)  PRIMARY REASON FOR VISIT:  1. Ductal carcinoma in situ (DCIS) of right breast   2. Basal cell carcinoma (BCC) of skin of lower extremity including hip, unspecified laterality    HISTORY OF PRESENT ILLNESS:   Andrea Harding, a 73 y.o. female, was seen for a [facility] cancer genetics consultation at the request of [name] Vanderbilt, MD due to a personal history of breast caner.  Andrea Harding presents today the at the Breast Multidisciplinary Clinic to discuss the possibility of a hereditary predisposition to cancer, genetic testing, and to further clarify her future cancer risks, as well as potential cancer risks for family members.  Diagnosis: In 2025, at the age of 46, Andrea Harding was diagnosed with stage 0 ductal carcinoma in situ of the right breast.  CANCER HISTORY:  Oncology History  Ductal carcinoma in situ (DCIS) of right breast  02/09/2024 Mammogram   Patient had screening mammogram which warranted further evaluation.  She had contrast-enhanced mammogram which showed three 1 cm enhancing asymmetries in the right breast at 12:00 which is suspicious and core biopsy was recommended.   02/09/2024 Pathology Results   Right breast needle core biopsy at 12:00 4 cm from nipple showed DCIS low to intermediate grade, solid and cribriform types with apocrine features, no necrosis, no calcifications.  Right breast needle core biopsy at 12:00 5 cm from the nipple once again showed DCIS low to intermediate grade necrosis focally present Not present estrogen 10% positive moderate staining progesterone 0% negative   02/15/2024 Initial Diagnosis   Ductal carcinoma in situ (DCIS) of right breast   02/16/2024 Cancer Staging   Staging form: Breast, AJCC 8th Edition - Clinical stage from 02/16/2024: Stage 0 (cTis (DCIS), cN0, cM0, ER+, PR-, HER2: Not  Assessed) - Signed by [name], MD on 02/16/2024 Stage prefix: Initial diagnosis Nuclear grade: G2 Laterality: Right Staged by: Pathologist and managing physician Stage used in treatment planning: Yes National guidelines used in treatment planning: Yes Type of national guideline used in treatment planning: NCCN    RISK FACTORS:  Menarche was at age 32.  First live birth at age 58.  OCP use for approximately 2-3 years.  Hysterectomy: yes.  Menopausal status: postmenopausal.  HRT use: 0 years. Colonoscopy: yes; normal. Mammogram within the last year: yes. Number of breast biopsies: 1. Tobacco Use: Never  Past Medical History:  Diagnosis Date   A-fib (HCC)    Acquired autoimmune hypothyroidism    Anxiety    Asthma    Asthma with bronchitis and status asthmaticus    Blood sugar increased    BMI 26.0-26.9,adult    Borderline hyperlipidemia    Chronic reflux esophagitis    Congestive heart failure (CHF) (HCC)    Dark stools    Depression    Drug therapy    Hypertension    Hypothyroidism    Leg swelling    Neoplasm of uncertain behavior of skin    Pemphigoid (HCC)    Skin cancer    Vaginal dryness    Vaginitis, atrophic    Varicella vaccination    Vitamin D deficiency     Past Surgical History:  Procedure Laterality Date   ABDOMINAL HYSTERECTOMY  1982   ANGLE FOOT Right    2009   CESAREAN SECTION     HERNIA REPAIR  1974   SQUAMOUS CELL CARCINOMA EXCISION  Right    07/24/15   TONSILLECTOMY  1961   Social History   Socioeconomic History   Marital status: Married    Spouse name: Not on file   Number of children: Not on file   Years of education: Not on file   Highest education level: Not on file  Occupational History   Not on file  Tobacco Use   Smoking status: Never   Smokeless tobacco: Never  Vaping Use   Vaping status: Never Used  Substance and Sexual Activity   Alcohol use: Never   Drug use: Never   Sexual activity: Not on file    Comment:  MARRIED  Other Topics Concern   Not on file  Social History Narrative   Not on file   Social Drivers of Health   Tobacco Use: Low Risk  (02/15/2024)   Received from [facility]   Patient History    Smoking Tobacco Use: Never    Smokeless Tobacco Use: Never    Passive Exposure: Not on file  Financial Resource Strain: Not on file  Food Insecurity: No Food Insecurity (02/16/2024)   Epic    Worried About Programme Researcher, Broadcasting/film/video in the Last Year: Never true    Ran Out of Food in the Last Year: Never true  Transportation Needs: No Transportation Needs (02/16/2024)   Epic    Lack of Transportation (Medical): No    Lack of Transportation (Non-Medical): No  Physical Activity: Not on file  Stress: No Stress Concern Present (04/23/2021)   Received from [facility] of Occupational Health - Occupational Stress Questionnaire    Feeling of Stress : Only a little  Social Connections: Unknown (07/15/2021)   Received from [facility]   Social Network    Social Network: Not on file  Depression [phone]): Low Risk (02/16/2024)   Depression (PHQ2-9)    PHQ-2 Score: 0  Alcohol Screen: Not on file  Housing: Low Risk (02/16/2024)   Epic    Unable to Pay for Housing in the Last Year: No    Number of Times Moved in the Last Year: 0    Homeless in the Last Year: No  Utilities: Not At Risk (02/16/2024)   Epic    Threatened with loss of utilities: No  Health Literacy: Not on file    FAMILY HISTORY:  We obtained a detailed, 4-generation family history pasted below.   Andrea Harding is unaware of relatives completing genetic testing for hereditary cancer risks.   Pedigree Summary Maternal Uncle - Unknown Cancer - Dx. Unknown age Note: Patient's husband has a history of thyroid  cancer for himself and his father and sister. I discussed that he could be seen in Cancer Genetics if he was interested, and he declined at this time.  Significant diagnoses are listed  below: Family History  Problem Relation Age of Onset   Heart attack Mother    Diabetes Mother    Thyroid  disease Mother    Hypertension Father    Heart disease Father    CVA Father    Cancer Maternal Uncle        Unknown Type   Diabetes Daughter    Heart disease Daughter    Stroke Son 40       2x   GENETIC COUNSELING ASSESSMENT: Andrea Harding is a 73 y.o. female with a personal history of breast cancer which is not very suggestive of a hereditary cancer predisposition syndrome. We, therefore, discussed and recommended  the following at today's visit.   DISCUSSION: We discussed that, in general, most cancer is not inherited in families, but instead is sporadic or familial. Sporadic cancers occur by chance and typically happen at older ages (>50 years) as this type of cancer is caused by genetic changes acquired during an individuals lifetime. Some families have more cancers than would be expected by chance; however, the ages or types of cancer are not consistent with a known genetic mutation or known genetic mutations have been ruled out. This type of familial cancer is thought to be due to a combination of multiple genetic, environmental, hormonal, and lifestyle factors. While this combination of factors likely increases the risk of cancer, the exact source of this risk is not currently identifiable or testable.  We discussed that 5-10% of cancer is the result of germline (heritable) genetic variants, with most cases associated with BRCA1/BRCA2. There are other genes that can be associated with hereditary  cancer syndromes. We discussed that testing is beneficial for several reasons including knowing how to follow individuals after completing their treatment, identifying whether potential treatment options such as PARP inhibitors would be beneficial, and understanding if other family members could be at risk for cancer and allow them to undergo genetic testing.   We reviewed the  characteristics, features and inheritance patterns of hereditary cancer syndromes. We also discussed genetic testing, including the appropriate family members to test, the process of testing, insurance coverage and turn-around-time for results. We discussed the implications of a negative, positive, carrier and/or variant of uncertain significant result. Andrea Harding  was offered a common hereditary cancer panel (40 genes) and an expanded pan-cancer panel (77 genes). Andrea Harding was informed of the benefits and limitations of each panel, including that expanded pan-cancer panels contain genes that do not have clear management guidelines at this point in time.  We also discussed that as the number of genes included on a panel increases, the chances of variants of uncertain significance increases.  GENETIC TESTING NATIONAL CRITERIA: Based on Andrea Harding's personal history of cancer she does not meet medical criteria for genetic testing based on the [employer] (NCCN) guidelines. Since Andrea Harding does not have a strong family history of cancer and her cancer was diagnosed at 32 we discussed that we have low suspicion that her cancer is caused by a germline genetic mutation.  GENETIC TESTING CONSENT:  After considering the risks, benefits, and limitations, Andrea Harding did NOT consent to pursue genetic testing.  Andrea Harding's questions were answered to her satisfaction today. Our contact information was provided should additional questions or concerns arise. Thank you for the referral and allowing us  to share in the care of your patient.   Resources:  Andrea Harding was provided with the following:  [facility] Billing information  [facility] Hereditary Cancer Testing Patient Guide [facility] CancerNext-Expanded + [employer] gene list  PLAN:  Testing Ordered: No testing ordered Clinic Note Faxed/Routed to [employer] PCP Liu, Xiao F, PA-C (Inactive)   Angeletta Goelz  Lindsey[place]Mills, MS, [facility]  Certified Genetic Counselor  Email: [email or web address]  Phone: [phone]  I personally spent a total of 20 minutes in the care of the patient today including preparing to see the patient, counseling and educating, and documenting clinical information in the EHR.  The patient was joined by [employer] her spouse. Drs. Lanny Stalls, and/or Gudena were available for questions, if needed. _______________________________________________________________________ For Office Staff:  Number of people involved in session: 2 Was an Intern/ student  involved with case: no

## 2024-02-16 NOTE — Progress Notes (Deleted)
 [facility] CONSULT NOTE  Patient Care Team: Liu, Xiao F, PA-C (Inactive) as PCP - General (Family Medicine) [name], MD as PCP - Cardiology (Cardiology) [name], RN as Oncology Nurse Navigator Vanderbilt [name], MD as Consulting Physician (General Surgery) [name], MD as Consulting Physician (Hematology and Oncology) [name], MD as Consulting Physician (Radiation Oncology)  CHIEF COMPLAINTS/PURPOSE OF CONSULTATION:  ***  ASSESSMENT & PLAN:  No problem-specific Assessment & Plan notes found for this encounter.  No orders of the defined types were placed in this encounter.    HISTORY OF PRESENTING ILLNESS:  Andrea Harding Seats 73 y.o. female is here because of ***  REVIEW OF SYSTEMS:   Constitutional: Denies fevers, chills or abnormal night sweats Eyes: Denies blurriness of vision, double vision or watery eyes Ears, nose, mouth, throat, and face: Denies mucositis or sore throat Respiratory: Denies cough, dyspnea or wheezes Cardiovascular: Denies palpitation, chest discomfort or lower extremity swelling Gastrointestinal:  Denies nausea, heartburn or change in bowel habits Skin: Denies abnormal skin rashes Lymphatics: Denies new lymphadenopathy or easy bruising Neurological:Denies numbness, tingling or new weaknesses Behavioral/Psych: Mood is stable, no new changes  All other systems were reviewed with the patient and are negative.  MEDICAL HISTORY:  Past Medical History:  Diagnosis Date   A-fib (HCC)    Acquired autoimmune hypothyroidism    Anxiety    Asthma    Asthma with bronchitis and status asthmaticus    Blood sugar increased    BMI 26.0-26.9,adult    Borderline hyperlipidemia    Chronic reflux esophagitis    Congestive heart failure (CHF) (HCC)    Dark stools    Depression    Drug therapy    Hypertension    Hypothyroidism    Leg swelling    Neoplasm of uncertain behavior of skin    Pemphigoid    Skin cancer     Vaginal dryness    Vaginitis, atrophic    Varicella vaccination    Vitamin D deficiency     SURGICAL HISTORY: Past Surgical History:  Procedure Laterality Date   ABDOMINAL HYSTERECTOMY  1982   ANGLE FOOT Right    2009   CESAREAN SECTION     HERNIA REPAIR  1974   SQUAMOUS CELL CARCINOMA EXCISION Right    07/24/15   TONSILLECTOMY  1961    SOCIAL HISTORY: Social History   Socioeconomic History   Marital status: Married    Spouse name: Not on file   Number of children: Not on file   Years of education: Not on file   Highest education level: Not on file  Occupational History   Not on file  Tobacco Use   Smoking status: Never   Smokeless tobacco: Never  Vaping Use   Vaping status: Never Used  Substance and Sexual Activity   Alcohol use: Never   Drug use: Never   Sexual activity: Not on file    Comment: MARRIED  Other Topics Concern   Not on file  Social History Narrative   Not on file   Social Drivers of Health   Tobacco Use: Low Risk  (02/15/2024)   Received from [facility]   Patient History    Smoking Tobacco Use: Never    Smokeless Tobacco Use: Never    Passive Exposure: Not on file  Financial Resource Strain: Not on file  Food Insecurity: Low Risk (01/24/2024)   Received from [facility]   Epic    Within  the past 12 months, you worried that your food would run out before you got money to buy more: Never true    Within the past 12 months, the food you bought just didn't last and you didn't have money to get more. : Never true  Transportation Needs: No Transportation Needs (01/24/2024)   Received from [facility]    In the past 12 months, has lack of reliable transportation kept you from medical appointments, meetings, work or from getting things needed for daily living? : No  Physical Activity: Not on file  Stress: No Stress Concern Present (04/23/2021)   Received from [facility]  Occupational Health - Occupational Stress Questionnaire    Feeling of Stress : Only a little  Social Connections: Unknown (07/15/2021)   Received from [facility]   Social Network    Social Network: Not on file  Intimate Partner Violence: Unknown (06/06/2021)   Received from [facility]   HITS    Physically Hurt: Not on file    Insult or Talk Down To: Not on file    Threaten Physical Harm: Not on file    Scream or Curse: Not on file  Depression (EYV7-0): Not on file  Alcohol Screen: Not on file  Housing: Low Risk (01/24/2024)   Received from [facility]   Epic    What is your living situation today?: I have a steady place to live    Think about the place you live. Do you have problems with any of the following? Choose all that apply:: None/None on this list  Utilities: Low Risk (01/24/2024)   Received from [facility]   Utilities    In the past 12 months has the electric, gas, oil, or water company threatened to shut off services in your home? : No  Health Literacy: Not on file    FAMILY HISTORY: Family History  Problem Relation Age of Onset   Heart attack Mother    Diabetes Mother    Thyroid  disease Mother    Hypertension Father    Heart disease Father    CVA Father     ALLERGIES:  is allergic to amoxicillin-pot clavulanate, amoxicillin, cephalexin, other, penicillins, and sulfa antibiotics.  MEDICATIONS:  Current Outpatient Medications  Medication Sig Dispense Refill   ADVAIR  DISKUS 250-50 MCG/ACT AEPB Inhale one dose twice daily to prevent cough or wheeze.  Rinse, gargle, and spit after use. 60 each 5   amitriptyline (ELAVIL) 10 MG tablet Take 10 mg by mouth at bedtime.     Biotin w/ Vitamins C & E (HAIR/SKIN/NAILS PO) Take by mouth.     CALCIUM PO Take by mouth.     citalopram (CELEXA) 40 MG tablet Take 40 mg by mouth every morning.     clonazePAM (KLONOPIN) 0.5 MG tablet Take 1 tablet by mouth 2 (two) times daily as needed.     colestipol  (COLESTID ) 1 g  tablet Can take one tablet one to two times daily as directed. 60 tablet 5   cyclobenzaprine (FLEXERIL) 10 MG tablet Take by mouth.     famotidine  (PEPCID ) 40 MG tablet Take 1 tablet by mouth at bedtime. 90 tablet 0   furosemide  (LASIX ) 20 MG tablet Take 1 tablet (20 mg total) by mouth daily. Please schedule appt for future refills. 1st attempt 90 tablet 0   levothyroxine (SYNTHROID, LEVOTHROID) 175 MCG tablet TAKE 1/2 TABLET BY MOUTH DAILY     losartan  (COZAAR ) 25 MG  tablet Take 25 mg by mouth daily.     MELATONIN PO Take by mouth.     metoprolol succinate (TOPROL-XL) 25 MG 24 hr tablet Take 1 tablet by mouth daily.     Multiple Vitamin (MULTIVITAMIN PO) Take by mouth.     OLANZapine (ZYPREXA) 5 MG tablet Take 5 mg by mouth at bedtime.     omeprazole  (PRILOSEC) 40 MG capsule Take one capsule by mouth twice daily as directed. 60 capsule 5   VENTOLIN HFA 108 (90 Base) MCG/ACT inhaler Inhale 2 puffs into the lungs every 4 (four) hours as needed.     VITAMIN D PO Take by mouth.     warfarin (COUMADIN) 5 MG tablet Take 1 tablet by mouth as directed. Take 1 tablet by mouth once a day except Fridays take 1.5 tablets     No current facility-administered medications for this visit.     PHYSICAL EXAMINATION: ECOG PERFORMANCE STATUS: {CHL ONC ECOG ED:[id number]}  Vitals:   02/16/24 0830  BP: 134/67  Pulse: 68  Resp: 16  Temp: (!) 97.2 F (36.2 C)  SpO2: 99%   Filed Weights   02/16/24 0830  Weight: 173 lb 3.2 oz (78.6 kg)    GENERAL:alert, no distress and comfortable SKIN: skin color, texture, turgor are normal, no rashes or significant lesions EYES: normal, conjunctiva are pink and non-injected, sclera clear OROPHARYNX:no exudate, no erythema and lips, buccal mucosa, and tongue normal  NECK: supple, thyroid  normal size, non-tender, without nodularity LYMPH:  no palpable lymphadenopathy in the cervical, axillary or inguinal LUNGS: clear to auscultation and percussion with normal  breathing effort HEART: regular rate & rhythm and no murmurs and no lower extremity edema ABDOMEN:abdomen soft, non-tender and normal bowel sounds Musculoskeletal:no cyanosis of digits and no clubbing  PSYCH: alert & oriented x 3 with fluent speech NEURO: no focal motor/sensory deficits  LABORATORY DATA:  I have reviewed the data as listed Lab Results  Component Value Date   WBC 7.2 05/06/2007   HGB 11.3 (L) 05/06/2007   HCT 32.6 (L) 05/06/2007   MCV 88.5 05/06/2007   PLT 191 05/06/2007     Chemistry      Component Value Date/Time   NA 145 (H) 10/27/2021 1321   K 4.1 10/27/2021 1321   CL 102 10/27/2021 1321   CO2 25 10/27/2021 1321   BUN 19 10/27/2021 1321   CREATININE 0.86 10/27/2021 1321      Component Value Date/Time   CALCIUM 9.1 10/27/2021 1321       RADIOGRAPHIC STUDIES: I have personally reviewed the radiological images as listed and agreed with the findings in the report. No results found.  All questions were answered. The patient knows to call the clinic with any problems, questions or concerns. I spent *** minutes in the care of this patient including H and P, review of records, counseling and coordination of care.     Amber Stalls, MD 02/16/2024 10:26 AM

## 2024-02-18 ENCOUNTER — Encounter: Payer: Self-pay | Admitting: *Deleted

## 2024-02-18 ENCOUNTER — Telehealth: Payer: Self-pay | Admitting: *Deleted

## 2024-02-18 NOTE — Telephone Encounter (Signed)
 Spoke with patient to follow up from [facility] 12/17 and assess navigation needs.   Patient denies any questions or concerns at this time.  Encouraged her to call should anything arise. Patient verbalized understanding.

## 2024-03-07 DIAGNOSIS — D0511 Intraductal carcinoma in situ of right breast: Secondary | ICD-10-CM

## 2024-03-21 ENCOUNTER — Other Ambulatory Visit: Payer: Self-pay

## 2024-03-21 ENCOUNTER — Encounter (HOSPITAL_BASED_OUTPATIENT_CLINIC_OR_DEPARTMENT_OTHER): Payer: Self-pay | Admitting: Surgery

## 2024-03-21 NOTE — Progress Notes (Signed)
" °   03/21/24 1430  PAT Phone Screen  Is the patient taking a GLP-1 receptor agonist? No  Do You Have Diabetes? No  Do You Have Hypertension? Yes  Have You Ever Been to the ER for Asthma? No  Have You Taken Oral Steroids in the Past 3 Months? No  Do you Take Phenteramine or any Other Diet Drugs? No  Recent  Lab Work, EKG, CXR? (S)  Yes (Echo 2023 EF 60-65%)  Where was this test performed? [facility]  Do you have a history of heart problems? (S)  Yes (chronic a-fib, CHF)  Cardiologist Name (S)  Maude Emmer  Have you ever had tests on your heart? Yes  What cardiac tests were performed? (S)  Echo;EKG  What date/year were cardiac tests completed? (S)  2023 EF 60-65%  Results viewable: [facility] Media Tab  Any Recent Hospitalizations? No  Height 5' 8 (1.727 m)  Weight 77.1 kg  Pat Appointment Scheduled (S)  Yes (BMP, INR, EKG)   Reviewed chart with Dr. Corinne. Pt on warfarin. Need a repeat INR, last draw on 1/13 was 4.1. Also need cardiac clearance; last apt was 08/2022 with a request for yearly follow up. No follow up appointment seen on chart since 2024.  "

## 2024-03-22 ENCOUNTER — Encounter (HOSPITAL_BASED_OUTPATIENT_CLINIC_OR_DEPARTMENT_OTHER)
Admission: RE | Admit: 2024-03-22 | Discharge: 2024-03-22 | Disposition: A | Discharge status: Home / Self Care | Source: Ambulatory Visit | Attending: Surgery | Admitting: Surgery

## 2024-03-22 ENCOUNTER — Telehealth (HOSPITAL_BASED_OUTPATIENT_CLINIC_OR_DEPARTMENT_OTHER): Payer: Self-pay | Admitting: *Deleted

## 2024-03-22 DIAGNOSIS — Z01818 Encounter for other preprocedural examination: Secondary | ICD-10-CM | POA: Diagnosis present

## 2024-03-22 LAB — BASIC METABOLIC PANEL WITH GFR
Anion gap: 9 (ref 5–15)
BUN: 18 mg/dL (ref 8–23)
CO2: 29 mmol/L (ref 22–32)
Calcium: 9 mg/dL (ref 8.9–10.3)
Chloride: 100 mmol/L (ref 98–111)
Creatinine, Ser: 0.91 mg/dL (ref 0.44–1.00)
GFR, Estimated: >60 mL/min
Glucose, Bld: 131 mg/dL — ABNORMAL HIGH (ref 70–99)
Potassium: 4.1 mmol/L (ref 3.5–5.1)
Sodium: 138 mmol/L (ref 135–145)

## 2024-03-22 NOTE — Progress Notes (Unsigned)
 "    Cardiology Office Note   Date:  03/24/2024   ID:  Andrea, Harding [birth year], MRN [id number]  PCP:  Liu, Xiao F, PA-C (Inactive)  Cardiologist:   Maude Emmer, MD   No chief complaint on file.     History of Present Illness: Andrea Harding is a 74 y.o. female f/u chronic afib and HTN  TTE reviewed from 05/07/21 EF 60-65% mild MR mild AR TTE reviewed from 05/02/20 EF 55-60% improved mild/mod MR mild AR  TTE reviewed from 05/01/19 EF 40-45% global mild MR/AR LAE 41 mm  Wanted to start Entresto  but patient preferred ARB    On Toprol and coumadin   She is asymptomatic No chest pain , fatigue palpitations or dyspnea.    PA in [place] checks INR every 4 weeks no bleeding issues   Daughter / son in law died and they are trying to care for 4 of their young kids Neither she or husband will get vaccines   Discussed DOAC multiple times she prefers to stay on coumadin  She has some dependant edema and uses PRN Lasix   Sees [facility] and had small fracture of fibular head left leg post steroid injection with conservative Rx planned   No cardiac issues Still wants to be on coumadin No bleeding issues She inquired about [facility] but she has no bleeding risk and has done well with anticoagulation and I did not encourage it Her coumadin is not followed by us  -> Dr Ofilia at [facility] in [place]  She needs lumpectomy with Dr Dora. She needs updated TTE last done 2023. Her afib rate control is fine. Will need to hold coumadin 4 days before lumpectomy Don't see reason to cover with lovenox.   Doing well with no cardiac symptoms Has already started holding her coumadin yesterday as surgery is on Tuesday. She understands small risk of stroke off coumadin She did not have to hold it for fine needle biopsies     Past Medical History:  Diagnosis Date   A-fib (HCC)    Acquired autoimmune hypothyroidism    Anxiety    Asthma    Asthma with bronchitis and status asthmaticus    Blood sugar  increased    BMI 26.0-26.9,adult    Borderline hyperlipidemia    Chronic reflux esophagitis    Congestive heart failure (CHF) (HCC)    Dark stools    Depression    Drug therapy    Hypertension    Hypothyroidism    Leg swelling    Neoplasm of uncertain behavior of skin    Pemphigoid (HCC)    Skin cancer    Vaginal dryness    Vaginitis, atrophic    Varicella vaccination    Vitamin D deficiency     Past Surgical History:  Procedure Laterality Date   ABDOMINAL HYSTERECTOMY  1982   ANGLE FOOT Right    2009   CESAREAN SECTION     HERNIA REPAIR  1974   SQUAMOUS CELL CARCINOMA EXCISION Right    07/24/15   TONSILLECTOMY  1961     Current Outpatient Medications  Medication Sig Dispense Refill   ADVAIR  DISKUS 250-50 MCG/ACT AEPB Inhale one dose twice daily to prevent cough or wheeze.  Rinse, gargle, and spit after use. 60 each 5   amitriptyline (ELAVIL) 10 MG tablet Take 10 mg by mouth at bedtime.     Biotin w/ Vitamins C & E (HAIR/SKIN/NAILS PO) Take by mouth.     CALCIUM PO  Take by mouth.     citalopram (CELEXA) 40 MG tablet Take 40 mg by mouth every morning.     clonazePAM (KLONOPIN) 0.5 MG tablet Take 1 tablet by mouth 2 (two) times daily as needed.     colestipol  (COLESTID ) 1 g tablet Can take one tablet one to two times daily as directed. 60 tablet 5   cyclobenzaprine (FLEXERIL) 10 MG tablet Take by mouth.     famotidine  (PEPCID ) 40 MG tablet Take 1 tablet by mouth at bedtime. 90 tablet 0   furosemide  (LASIX ) 20 MG tablet Take 1 tablet (20 mg total) by mouth daily. Please schedule appt for future refills. 1st attempt 90 tablet 0   levothyroxine (SYNTHROID, LEVOTHROID) 175 MCG tablet TAKE 1/2 TABLET BY MOUTH DAILY     losartan  (COZAAR ) 25 MG tablet Take 25 mg by mouth daily.     MELATONIN PO Take by mouth.     metoprolol succinate (TOPROL-XL) 25 MG 24 hr tablet Take 1 tablet by mouth daily.     Multiple Vitamin (MULTIVITAMIN PO) Take by mouth.     OLANZapine (ZYPREXA) 5 MG  tablet Take 5 mg by mouth at bedtime.     omeprazole  (PRILOSEC) 40 MG capsule Take one capsule by mouth twice daily as directed. 60 capsule 5   VENTOLIN HFA 108 (90 Base) MCG/ACT inhaler Inhale 2 puffs into the lungs every 4 (four) hours as needed.     VITAMIN D PO Take by mouth.     warfarin (COUMADIN) 5 MG tablet Take 1 tablet by mouth as directed. Take 1 tablet by mouth once a day except Fridays take 1.5 tablets (Patient taking differently: Take 1 tablet by mouth as directed. Take 1 tablet by mouth every other day then 2.5 mg the remaining days)     No current facility-administered medications for this visit.    Allergies:   Amoxicillin-pot clavulanate, Amoxicillin, Cephalexin, Other, Penicillins, and Sulfa antibiotics    Social History:  The patient  reports that she has never smoked. She has never used smokeless tobacco. She reports that she does not drink alcohol and does not use drugs.   Family History:  The patient's family history includes CVA in her father; Cancer in her maternal uncle; Diabetes in her daughter and mother; Heart attack in her mother; Heart disease in her daughter and father; Hypertension in her father; Stroke (age of onset: 60) in her son; Thyroid  disease in her mother.    ROS:  Please see the history of present illness.   Otherwise, review of systems are positive for none.   All other systems are reviewed and negative.    PHYSICAL EXAM: VS:  There were no vitals taken for this visit. , BMI There is no height or weight on file to calculate BMI. Affect appropriate Healthy:  appears stated age HEENT: normal Neck supple with no adenopathy JVP normal no bruits no thyromegaly Lungs clear with no wheezing and good diaphragmatic motion Heart:  S1/S2 no murmur, no rub, gallop or click PMI normal Abdomen: benighn, BS positve, no tenderness, no AAA no bruit.  No HSM or HJR Distal pulses intact with no bruits No edema Neuro non-focal Skin warm and dry No muscular  weakness   EKG:   05/01/19 afib rate 72 nonspecific ST changes 03/24/2024 afib  rate 79 normal    Recent Labs: 02/16/2024: ALT 28; Hemoglobin 13.7; Platelet Count 203 03/22/2024: BUN 18; Creatinine, Ser 0.91; Potassium 4.1; Sodium 138    Lipid Panel  No results found for: CHOL, TRIG, HDL, CHOLHDL, VLDL, LDLCALC, LDLDIRECT    Wt Readings from Last 3 Encounters:  02/16/24 78.6 kg  09/17/22 79.4 kg  09/17/21 85.8 kg     Other studies Reviewed: Additional studies/ records that were reviewed today include: Notes from DR [facility] labs, ECG .Echo 05/01/19   ASSESSMENT AND PLAN:  1.  Afib: Previous discussion regarding  rate control and anticoagulation vs conversion strategy Being asymptomatic decided on rate control and anticoagulation Currently on coumadin Discussed DOAC and she prefers to stay on coumadin INR been Rx most recent 03/14/24 elevated at 4.1 Coumadin seems to be followed by [facility]  2. Thyroid :  TSH normal continue synthroid replacement  3. HTN:  Well controlled.  Continue current medications and low sodium Dash type diet.    4. Depression: continue Celexa mood seems appropriate  5. Mild Systolic Dysfunction :  normalized on most recent echo 05/07/21 with only stable mild AR/MR Continue ARB, Toprol and lasix  Will update echo   6. Edema:  non on exam today doubt CHF see below regarding labs and PRN lasix  Continue compression stockings and low sodium diet   7. Ortho:  f/u [facility] for left fibular fracture and knee pain Conservative plan to date  8. Preoperative. :  Her afib is well controlled She is on coumadin This is followed at [facility] by Dr Ofilia His note mentions holding for 5 days before surgery but I think 4 days would be sufficient Don't think lovenox bridging is needed. Clear to have surgery even before updated TTE done   Echo for afib. Can be done after surgery /lumpectomy  Disposition:   FU with me in a year       Signed, Maude Emmer, MD  03/24/2024 1:27 PM     "

## 2024-03-22 NOTE — Telephone Encounter (Signed)
 Please advise holding Coumadin prior to lumpectomy Last labs December 2025  Thank you!  DW

## 2024-03-22 NOTE — Telephone Encounter (Signed)
 I received a secure chat from one of our RMA [facility] asking if I had a preop clearance request for this pt as the pt is seeing Dr. Delford 03/24/24. I stated I did not see a request come through yet. [name] said she will reach out to Dr. Milta nurse asking if they will re-fax again today.   I received the fax today and will present to the preop APP to run through our protocol.      Pre-operative Risk Assessment    Patient Name: Andrea Harding  DOB: Jun 26, 1950 MRN: [id number]   Date of last office visit: 08/31/22 DR. NISHAN Date of next office visit: 03/24/24 DR. [facility]   Request for Surgical Clearance    Procedure:  LUMPECTOMY  Date of Surgery:  Clearance 03/28/24                                Surgeon:  DR. DEBBY SHIPPER Surgeon's Group or Practice Name:  [facility]  Phone number:  [phone] Fax number:  [phone] MICHELLE BROOKS, CMA   Type of Clearance Requested:   - Medical  - Pharmacy:  Hold Warfarin (Coumadin)     Type of Anesthesia:  General    Additional requests/questions:    Andrea Harding   03/22/2024, 11:08 AM

## 2024-03-22 NOTE — Telephone Encounter (Addendum)
" ° °  Name: Andrea Harding  DOB: January 03, 1951  MRN: [id number]  Primary Cardiologist: Maude Emmer, MD  Chart reviewed as part of pre-operative protocol coverage. Because of Andrea Harding's past medical history and time since last visit, she will require a follow-up in-office visit in order to better assess preoperative cardiovascular risk.  Patient has an office visit scheduled on 03/24/2024 with Dr. Nishan. Appointment notes have been updated to reflect need for pre-op evaluation.   Pre-op covering staff:  - Please contact requesting surgeon's office via preferred method (i.e, phone, fax) to inform them of need for appointment prior to surgery.  This message will also be routed to pharmacy pool for input on holding Coumadin as requested below so that this information is available to the clearing provider at time of patient's appointment.   ADDENDUM: Coumadin is managed by a noncardiology provider therefore recommendations for holding deferred to prescribing provider.    Barnie Hila, NP  03/22/2024, 11:21 AM    "

## 2024-03-22 NOTE — Progress Notes (Signed)
      Enhanced Recovery after Surgery Enhanced Recovery after Surgery is a protocol used to improve the stress on your body and your recovery after surgery.  Patient Instructions  The night before surgery:  No food after midnight. ONLY clear liquids after midnight  The day of surgery (if you do NOT have diabetes):  Drink ONE (1) Pre-Surgery Clear Ensure as directed.   This drink was given to you during your hospital  pre-op appointment visit. The pre-op nurse will instruct you on the time to drink the  Pre-Surgery Ensure depending on your surgery time. Finish the drink at the designated time by the pre-op nurse.  Nothing else to drink after completing the  Pre-Surgery Clear Ensure.  The day of surgery (if you have diabetes): Drink ONE (1) Gatorade 2 (G2) as directed. This drink was given to you during your hospital  pre-op appointment visit.  The pre-op nurse will instruct you on the time to drink the   Gatorade 2 (G2) depending on your surgery time. Color of the Gatorade may vary. Red is not allowed. Nothing else to drink after completing the  Gatorade 2 (G2).         If you have questions, please contact your surgeon's office.   Patient given CHG presurgical soap. Education provided and patient verbalized understanding.

## 2024-03-22 NOTE — Telephone Encounter (Signed)
 Clearance to hold warfarin should be sent to [facility]

## 2024-03-22 NOTE — Telephone Encounter (Signed)
 Called the requesting office and sent over the coumadin hold time.

## 2024-03-24 ENCOUNTER — Ambulatory Visit: Attending: Cardiovascular Disease | Admitting: Cardiovascular Disease

## 2024-03-24 ENCOUNTER — Encounter: Payer: Self-pay | Admitting: Cardiovascular Disease

## 2024-03-24 VITALS — BP 116/68 | HR 66 | Ht 68.0 in | Wt 173.0 lb

## 2024-03-24 DIAGNOSIS — Z5181 Encounter for therapeutic drug level monitoring: Secondary | ICD-10-CM | POA: Insufficient documentation

## 2024-03-24 DIAGNOSIS — I10 Essential (primary) hypertension: Secondary | ICD-10-CM | POA: Insufficient documentation

## 2024-03-24 DIAGNOSIS — Z7901 Long term (current) use of anticoagulants: Secondary | ICD-10-CM | POA: Insufficient documentation

## 2024-03-24 DIAGNOSIS — I4891 Unspecified atrial fibrillation: Secondary | ICD-10-CM | POA: Diagnosis present

## 2024-03-24 NOTE — Progress Notes (Signed)
 Patient notified to come in for PT/ INR on Monday if weather permits. But if unable to come on Monday due to weather then patient instructed to come at 8am on Tuesday for labwork. Patient verbalized understanding.

## 2024-03-24 NOTE — Patient Instructions (Addendum)
 Medication Instructions:  Your physician recommends that you continue on your current medications as directed. Please refer to the Current Medication list given to you today.  *If you need a refill on your cardiac medications before your next appointment, please call your pharmacy*  Lab Work: NONE ordered at this time of appointment   Testing/Procedures: Your physician has requested that you have an echocardiogram. Echocardiography is a painless test that uses sound waves to create images of your heart. It provides your doctor with information about the size and shape of your heart and how well your hearts chambers and valves are working. This procedure takes approximately one hour. There are no restrictions for this procedure. Please do NOT wear cologne, perfume, aftershave, or lotions (deodorant is allowed). Please arrive 15 minutes prior to your appointment time.  Please note: We ask at that you not bring children with you during ultrasound (echo/ vascular) testing. Due to room size and safety concerns, children are not allowed in the ultrasound rooms during exams. Our front office staff cannot provide observation of children in our lobby area while testing is being conducted. An adult accompanying a patient to their appointment will only be allowed in the ultrasound room at the discretion of the ultrasound technician under special circumstances. We apologize for any inconvenience.   Follow-Up: At [facility], you and your health needs are our priority.  As part of our continuing mission to provide you with exceptional heart care, our providers are all part of one team.  This team includes your primary Cardiologist (physician) and Advanced Practice Providers or APPs (Physician Assistants and Nurse Practitioners) who all work together to provide you with the care you need, when you need it.  Your next appointment:   1 year Provider:   Maude Emmer, MD    We recommend signing up  for the patient portal called MyChart.  Sign up information is provided on this After Visit Summary.  MyChart is used to connect with patients for Virtual Visits (Telemedicine).  Patients are able to view lab/test results, encounter notes, upcoming appointments, etc.  Non-urgent messages can be sent to your provider as well.   To learn more about what you can do with MyChart, go to [email or web address].   Other Instructions

## 2024-03-28 ENCOUNTER — Encounter (HOSPITAL_BASED_OUTPATIENT_CLINIC_OR_DEPARTMENT_OTHER)
Admission: RE | Disposition: A | Discharge status: Home / Self Care | Payer: Self-pay | Source: Home / Self Care | Attending: Surgery

## 2024-03-28 ENCOUNTER — Ambulatory Visit (HOSPITAL_BASED_OUTPATIENT_CLINIC_OR_DEPARTMENT_OTHER): Payer: Self-pay | Admitting: Anesthesiology

## 2024-03-28 ENCOUNTER — Encounter (HOSPITAL_BASED_OUTPATIENT_CLINIC_OR_DEPARTMENT_OTHER): Payer: Self-pay | Admitting: Surgery

## 2024-03-28 ENCOUNTER — Ambulatory Visit (HOSPITAL_BASED_OUTPATIENT_CLINIC_OR_DEPARTMENT_OTHER)
Admission: RE | Admit: 2024-03-28 | Discharge: 2024-03-28 | Disposition: A | Discharge status: Home / Self Care | Attending: Surgery | Admitting: Surgery

## 2024-03-28 ENCOUNTER — Other Ambulatory Visit: Payer: Self-pay

## 2024-03-28 DIAGNOSIS — Z7901 Long term (current) use of anticoagulants: Secondary | ICD-10-CM | POA: Diagnosis not present

## 2024-03-28 DIAGNOSIS — E119 Type 2 diabetes mellitus without complications: Secondary | ICD-10-CM | POA: Insufficient documentation

## 2024-03-28 DIAGNOSIS — D0511 Intraductal carcinoma in situ of right breast: Secondary | ICD-10-CM | POA: Diagnosis present

## 2024-03-28 DIAGNOSIS — F419 Anxiety disorder, unspecified: Secondary | ICD-10-CM | POA: Diagnosis not present

## 2024-03-28 DIAGNOSIS — J449 Chronic obstructive pulmonary disease, unspecified: Secondary | ICD-10-CM

## 2024-03-28 DIAGNOSIS — F32A Depression, unspecified: Secondary | ICD-10-CM | POA: Diagnosis not present

## 2024-03-28 DIAGNOSIS — I480 Paroxysmal atrial fibrillation: Secondary | ICD-10-CM | POA: Diagnosis not present

## 2024-03-28 DIAGNOSIS — E039 Hypothyroidism, unspecified: Secondary | ICD-10-CM | POA: Diagnosis not present

## 2024-03-28 DIAGNOSIS — I10 Essential (primary) hypertension: Secondary | ICD-10-CM

## 2024-03-28 DIAGNOSIS — Z170 Estrogen receptor positive status [ER+]: Secondary | ICD-10-CM | POA: Diagnosis not present

## 2024-03-28 DIAGNOSIS — J4489 Other specified chronic obstructive pulmonary disease: Secondary | ICD-10-CM | POA: Insufficient documentation

## 2024-03-28 DIAGNOSIS — Z1722 Progesterone receptor negative status: Secondary | ICD-10-CM | POA: Diagnosis not present

## 2024-03-28 DIAGNOSIS — K219 Gastro-esophageal reflux disease without esophagitis: Secondary | ICD-10-CM | POA: Diagnosis not present

## 2024-03-28 LAB — PROTIME-INR
INR: 1 (ref 0.8–1.2)
Prothrombin Time: 14.1 s (ref 11.4–15.2)

## 2024-03-28 MED ORDER — 0.9 % SODIUM CHLORIDE (POUR BTL) OPTIME
TOPICAL | Status: DC | PRN
  Administered 2024-03-28: 1000 mL

## 2024-03-28 MED ORDER — OXYCODONE HCL 5 MG PO TABS: 5.0000 mg | ORAL_TABLET | Freq: Four times a day (QID) | ORAL | 0 refills | Status: AC | PRN

## 2024-03-28 MED ORDER — LACTATED RINGERS IV SOLN: INTRAVENOUS | Status: DC | PRN

## 2024-03-28 MED ORDER — CHLORHEXIDINE GLUCONATE CLOTH 2 % EX PADS: 6.0000 | MEDICATED_PAD | Freq: Once | CUTANEOUS | Status: DC

## 2024-03-28 MED ORDER — MIDAZOLAM HCL (PF) 2 MG/2ML IJ SOLN: 0.5000 mg | Freq: Once | INTRAMUSCULAR | Status: DC | PRN

## 2024-03-28 MED ORDER — CLINDAMYCIN PHOSPHATE 900 MG/50ML IV SOLN
INTRAVENOUS | Status: AC
  Filled 2024-03-28: qty 50

## 2024-03-28 MED ORDER — OXYCODONE HCL 5 MG/5ML PO SOLN: 5.0000 mg | Freq: Once | ORAL | Status: DC | PRN

## 2024-03-28 MED ORDER — FENTANYL CITRATE (PF) 100 MCG/2ML IJ SOLN: 25.0000 ug | INTRAMUSCULAR | Status: DC | PRN

## 2024-03-28 MED ORDER — OXYCODONE HCL 5 MG PO TABS: 5.0000 mg | ORAL_TABLET | Freq: Once | ORAL | Status: DC | PRN

## 2024-03-28 MED ORDER — BUPIVACAINE-EPINEPHRINE 0.25% -1:200000 IJ SOLN
INTRAMUSCULAR | Status: DC | PRN
  Administered 2024-03-28: 20 mL

## 2024-03-28 MED ORDER — DEXAMETHASONE SOD PHOSPHATE PF 10 MG/ML IJ SOLN
INTRAMUSCULAR | Status: DC | PRN
  Administered 2024-03-28: 10 mg via INTRAVENOUS

## 2024-03-28 MED ORDER — LABETALOL HCL 5 MG/ML IV SOLN
5.0000 mg | INTRAVENOUS | Status: DC | PRN
  Administered 2024-03-28: 5 mg via INTRAVENOUS

## 2024-03-28 MED ORDER — LACTATED RINGERS IV SOLN: INTRAVENOUS | Status: DC

## 2024-03-28 MED ORDER — ESMOLOL HCL 100 MG/10ML IV SOLN
INTRAVENOUS | Status: DC | PRN
  Administered 2024-03-28 (×2): 30 mg via INTRAVENOUS

## 2024-03-28 MED ORDER — ONDANSETRON HCL 4 MG/2ML IJ SOLN
INTRAMUSCULAR | Status: DC | PRN
  Administered 2024-03-28: 4 mg via INTRAVENOUS

## 2024-03-28 MED ORDER — LABETALOL HCL 5 MG/ML IV SOLN
INTRAVENOUS | Status: AC
  Filled 2024-03-28: qty 4

## 2024-03-28 MED ORDER — LABETALOL HCL 5 MG/ML IV SOLN
INTRAVENOUS | Status: DC | PRN
  Administered 2024-03-28: 5 mg via INTRAVENOUS

## 2024-03-28 MED ORDER — ACETAMINOPHEN 500 MG PO TABS
1000.0000 mg | ORAL_TABLET | Freq: Once | ORAL | Status: AC
  Administered 2024-03-28: 1000 mg via ORAL

## 2024-03-28 MED ORDER — ACETAMINOPHEN 500 MG PO TABS
ORAL_TABLET | ORAL | Status: AC
  Filled 2024-03-28: qty 2

## 2024-03-28 MED ORDER — EPHEDRINE SULFATE (PRESSORS) 25 MG/5ML IV SOSY
PREFILLED_SYRINGE | INTRAVENOUS | Status: DC | PRN
  Administered 2024-03-28: 5 mg via INTRAVENOUS
  Administered 2024-03-28: 10 mg via INTRAVENOUS
  Administered 2024-03-28 (×2): 5 mg via INTRAVENOUS

## 2024-03-28 MED ORDER — CLINDAMYCIN PHOSPHATE 900 MG/50ML IV SOLN
900.0000 mg | INTRAVENOUS | Status: AC
  Administered 2024-03-28: 900 mg via INTRAVENOUS

## 2024-03-28 MED ORDER — FENTANYL CITRATE (PF) 100 MCG/2ML IJ SOLN
INTRAMUSCULAR | Status: DC | PRN
  Administered 2024-03-28 (×2): 50 ug via INTRAVENOUS

## 2024-03-28 MED ORDER — LIDOCAINE 2% (20 MG/ML) 5 ML SYRINGE
INTRAMUSCULAR | Status: DC | PRN
  Administered 2024-03-28: 40 mg via INTRAVENOUS

## 2024-03-28 MED ORDER — FENTANYL CITRATE (PF) 100 MCG/2ML IJ SOLN
INTRAMUSCULAR | Status: AC
  Filled 2024-03-28: qty 2

## 2024-03-28 MED ORDER — PROPOFOL 10 MG/ML IV BOLUS
INTRAVENOUS | Status: DC | PRN
  Administered 2024-03-28: 150 mg via INTRAVENOUS

## 2024-03-28 NOTE — Discharge Instructions (Addendum)
 [facility] Office Phone Number [phone]  BREAST BIOPSY/ PARTIAL MASTECTOMY: POST OP INSTRUCTIONS  Always review your discharge instruction sheet given to you by the facility where your surgery was performed.  IF YOU HAVE DISABILITY OR FAMILY LEAVE FORMS, YOU MUST BRING THEM TO THE OFFICE FOR PROCESSING.  DO NOT GIVE THEM TO YOUR DOCTOR.  A prescription for pain medication may be given to you upon discharge.  Take your pain medication as prescribed, if needed.  If narcotic pain medicine is not needed, then you may take acetaminophen  (Tylenol ) or ibuprofen (Advil) as needed. No Tylenol  until after 2:15pm today. Take your usually prescribed medications unless otherwise directed If you need a refill on your pain medication, please contact your pharmacy.  They will contact our office to request authorization.  Prescriptions will not be filled after 5pm or on week-ends. You should eat very light the first 24 hours after surgery, such as soup, crackers, pudding, etc.  Resume your normal diet the day after surgery. Most patients will experience some swelling and bruising in the breast.  Ice packs and a good support bra will help.  Swelling and bruising can take several days to resolve.  It is common to experience some constipation if taking pain medication after surgery.  Increasing fluid intake and taking a stool softener will usually help or prevent this problem from occurring.  A mild laxative (Milk of Magnesia or Miralax) should be taken according to package directions if there are no bowel movements after 48 hours. Unless discharge instructions indicate otherwise, you may remove your bandages 24-48 hours after surgery, and you may shower at that time.  You may have steri-strips (small skin tapes) in place directly over the incision.  These strips should be left on the skin for 7-10 days.  If your surgeon used skin glue on the incision, you may shower in 24 hours.  The glue will flake  off over the next 2-3 weeks.  Any sutures or staples will be removed at the office during your follow-up visit. ACTIVITIES:  You may resume regular daily activities (gradually increasing) beginning the next day.  Wearing a good support bra or sports bra minimizes pain and swelling.  You may have sexual intercourse when it is comfortable. You may drive when you no longer are taking prescription pain medication, you can comfortably wear a seatbelt, and you can safely maneuver your car and apply brakes. RETURN TO WORK:  ______________________________________________________________________________________ Rosine should see your doctor in the office for a follow-up appointment approximately two weeks after your surgery.  Your doctors nurse will typically make your follow-up appointment when she calls you with your pathology report.  Expect your pathology report 2-3 business days after your surgery.  You may call to check if you do not hear from us  after three days. OTHER INSTRUCTIONS: _______________________________________________________________________________________________ _____________________________________________________________________________________________________________________________________ _____________________________________________________________________________________________________________________________________ _____________________________________________________________________________________________________________________________________  WHEN TO CALL YOUR DOCTOR: Fever over 101.0 Nausea and/or vomiting. Extreme swelling or bruising. Continued bleeding from incision. Increased pain, redness, or drainage from the incision.  The clinic staff is available to answer your questions during regular business hours.  Please dont hesitate to call and ask to speak to one of the nurses for clinical concerns.  If you have a medical emergency, go to the nearest emergency room or call  911.  A surgeon from [facility] is always on call at the hospital.  For further questions, please visit [email or web address]     Start Coumadin on Thursday 5 mg p.o. in  the evening and then resume regular pattern as before  Post Anesthesia Home Care Instructions  Activity: Get plenty of rest for the remainder of the day. A responsible individual must stay with you for 24 hours following the procedure.  For the next 24 hours, DO NOT: -Drive a car -Advertising copywriter -Drink alcoholic beverages -Take any medication unless instructed by your physician -Make any legal decisions or sign important papers.  Meals: Start with liquid foods such as gelatin or soup. Progress to regular foods as tolerated. Avoid greasy, spicy, heavy foods. If nausea and/or vomiting occur, drink only clear liquids until the nausea and/or vomiting subsides. Call your physician if vomiting continues.  Special Instructions/Symptoms: Your throat may feel dry or sore from the anesthesia or the breathing tube placed in your throat during surgery. If this causes discomfort, gargle with warm salt water. The discomfort should disappear within 24 hours.  If you had a scopolamine patch placed behind your ear for the management of post- operative nausea and/or vomiting:  1. The medication in the patch is effective for 72 hours, after which it should be removed.  Wrap patch in a tissue and discard in the trash. Wash hands thoroughly with soap and water. 2. You may remove the patch earlier than 72 hours if you experience unpleasant side effects which may include dry mouth, dizziness or visual disturbances. 3. Avoid touching the patch. Wash your hands with soap and water after contact with the patch.

## 2024-03-28 NOTE — Op Note (Signed)
 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  Tissue oriented with ink.  Drains: None  Indications for procedure: The patient is a 74 year old female with right breast DCIS.  She is opted for right breast lumpectomy after reviewing all of her surgical options and medical as well as radiation options.The procedure has been discussed with the patient. Alternatives to surgery have been discussed with the patient.  Risks of surgery include bleeding,  Infection,  Seroma formation, death,  and the need for further surgery.   The patient understands and wishes to proceed.     Description of procedure: The patient was met in the holding area questions were answered.  The right breast was marked as the correct site.  Of note of Magseed was placed as an outpatient by radiology.  She was then taken to the operating room placed upon the OR table.  After induction of general anesthesia, the right breast was prepped and draped in a sterile fashion and timeout performed.  The Sentimag probe was used to identify the seed with the help with the mammograms.  This was in the central to upper outer quadrant of the right breast.  A transverse incision was made.  Dissection was carried down all tissue around the localizing Magseed and neighboring tissue were excised.  Margins appear grossly negative.  Tissue was oriented with ink and images were obtained which shows the [facility] and clips to be in the specimen.  Hemostasis was achieved with cautery.  Local anesthetic infiltrated throughout clips placed to mark the cavity.  The deep tissue planes were approximated with 3-0 Vicryl.  4 Monocryl was used to close the skin in a  subcuticular fashion.  Dermabond applied and breast binder placed.  All counts were found to be correct.  The patient was awoke extubated taken to recovery in satisfactory condition.

## 2024-03-28 NOTE — Transfer of Care (Signed)
 Immediate Anesthesia Transfer of Care Note  Patient: Andrea Harding  Procedure(s) Performed: LUMPECTOMY WITH MAGNETIC MARKER LOCALIZATION (Right: Breast)  Patient Location: PACU  Anesthesia Type:General  Level of Consciousness: drowsy  Airway & Oxygen Therapy: Patient Spontanous Breathing and Patient connected to nasal cannula oxygen  Post-op Assessment: Report given to RN and Post -op Vital signs reviewed and stable  Post vital signs: Reviewed and stable  Last Vitals:  Vitals Value Taken Time  BP 148/92 03/28/24 12:07  Temp    Pulse 107 03/28/24 12:12  Resp 10 03/28/24 12:13  SpO2 91 % 03/28/24 12:12  Vitals shown include unfiled device data.  Last Pain:  Vitals:   03/28/24 0815  TempSrc: Temporal  PainSc: 0-No pain      Patients Stated Pain Goal: 3 (03/28/24 0815)  Complications: No notable events documented.

## 2024-03-28 NOTE — Anesthesia Preprocedure Evaluation (Addendum)
"                                    Anesthesia Evaluation  Patient identified by MRN, date of birth, ID band Patient awake    Reviewed: Allergy & Precautions, NPO status , Patient's Chart, lab work & pertinent test results, reviewed documented beta blocker date and time   History of Anesthesia Complications Negative for: history of anesthetic complications  Airway Mallampati: II  TM Distance: >3 FB Neck ROM: Full    Dental  (+) Dental Advisory Given   Pulmonary asthma , COPD (no longer requires inhaler)   breath sounds clear to auscultation       Cardiovascular hypertension, Pt. on medications and Pt. on home beta blockers (-) angina + dysrhythmias Atrial Fibrillation  Rhythm:Irregular Rate:Normal  '23 ECHO: EF 60 to 65%.  1. The LV has normal function, no regional wall motion abnormalities.   2. RVF is normal. The right ventricular size is normal. There is normal pulmonary artery systolic pressure. The estimated right ventricular systolic pressure is 28.2 mmHg.   3. Left atrial size was moderately dilated.   4. The mitral valve is normal in structure. Mild mitral valve regurgitation. No evidence of mitral stenosis.   5. The aortic valve is normal in structure. Aortic valve regurgitation is mild. Aortic valve sclerosis is present, with no evidence of aortic valve stenosis.     Neuro/Psych   Anxiety Depression    negative neurological ROS     GI/Hepatic Neg liver ROS,GERD  Medicated and Controlled,,  Endo/Other  diabetes (diet control)Hypothyroidism    Renal/GU negative Renal ROS     Musculoskeletal  (+) Arthritis ,    Abdominal   Peds  Hematology Coumadin: last 5d ago, INR 1.0   Anesthesia Other Findings DCIS  Reproductive/Obstetrics                              Anesthesia Physical Anesthesia Plan  ASA: 3  Anesthesia Plan: General   Post-op Pain Management: Tylenol  PO (pre-op)*   Induction: Intravenous  PONV Risk  Score and Plan: 3 and Ondansetron , Dexamethasone  and Treatment may vary due to age or medical condition  Airway Management Planned: LMA  Additional Equipment: None  Intra-op Plan:   Post-operative Plan:   Informed Consent: I have reviewed the patients History and Physical, chart, labs and discussed the procedure including the risks, benefits and alternatives for the proposed anesthesia with the patient or authorized representative who has indicated his/her understanding and acceptance.     Dental advisory given  Plan Discussed with: CRNA and Surgeon  Anesthesia Plan Comments:          Anesthesia Quick Evaluation  "

## 2024-03-28 NOTE — Anesthesia Procedure Notes (Signed)
 Procedure Name: LMA Insertion Date/Time: 03/28/2024 10:59 AM  Performed by: Lenard Lacks, CRNAPre-anesthesia Checklist: Patient identified, Emergency Drugs available, Suction available and Patient being monitored Patient Re-evaluated:Patient Re-evaluated prior to induction Oxygen Delivery Method: Circle system utilized Preoxygenation: Pre-oxygenation with 100% oxygen Induction Type: IV induction LMA: LMA inserted LMA Size: 4.0 Number of attempts: 1 Placement Confirmation: positive ETCO2 and breath sounds checked- equal and bilateral Dental Injury: Teeth and Oropharynx as per pre-operative assessment

## 2024-03-28 NOTE — H&P (Signed)
 History of Present Illness: Andrea Harding is a 74 y.o. female who is seen today as an office consultation for evaluation of Breast Cancer  Patient seen today in the [facility] for evaluation of right breast DCIS. This was detected on screening mammogram after core biopsy. She only complains of soreness and swelling since she had some bleeding from the procedure.  Review of Systems: A complete review of systems was obtained from the patient. I have reviewed this information and discussed as appropriate with the patient. See HPI as well for other ROS.    Medical History: Past Medical History:  Diagnosis Date  Anxiety  Asthma, unspecified asthma severity, unspecified whether complicated, unspecified whether persistent (HHS-HCC)  GERD (gastroesophageal reflux disease)  History of cancer  Hyperlipidemia  Hypertension  Thyroid  disease   There is no problem list on file for this patient.  Past Surgical History:  Procedure Laterality Date  CESAREAN SECTION N/A  e/o squamous cell  HERNIA REPAIR  HYSTERECTOMY    Allergies  Allergen Reactions  Penicillins Muscle Pain and Other (See Comments)  SEVERE  Muscle pain  Other reaction(s): Myalgia Difficulty swallowing SEVERE  Difficulty swallowing  Other Other (See Comments)  BAND-AIDS CAUSE SKIN TEARS  SULFA DRUGS  Sulfa (Sulfonamide Antibiotics) Other (See Comments) and Swelling  ask  Olanzapine Other (See Comments)  Weight gain  Cephalexin Other (See Comments) and Nausea   Current Outpatient Medications on File Prior to Visit  Medication Sig Dispense Refill  amitriptyline (ELAVIL) 10 MG tablet Take 10 mg by mouth at bedtime  citalopram (CELEXA) 40 MG tablet Take 1 tablet (40 mg total) by mouth daily. anxiety  clonazePAM (KLONOPIN) 0.5 MG tablet Take 1 tablet (0.5 mg total) by mouth 2 (two) times a day. As needed, anxiety  colestipoL  (COLESTID ) 1 gram tablet Can take one tablet one to two times daily as directed.   cyclobenzaprine (FLEXERIL) 10 MG tablet Take by mouth  famotidine  (PEPCID ) 40 MG tablet Take 40 mg by mouth at bedtime  fluticasone propion-salmeteroL (ADVAIR  DISKUS) 250-50 mcg/dose diskus inhaler Inhale one dose twice daily to prevent cough or wheeze. Rinse, gargle, and spit after use.  FUROsemide  (LASIX ) 20 MG tablet Take 20 mg by mouth  levothyroxine (SYNTHROID) 175 MCG tablet Take 0.5 tablets (87.5 mcg total) by mouth daily. thyroid   losartan  (COZAAR ) 25 MG tablet Take 25 mg by mouth once daily  metoprolol SUCCinate (TOPROL-XL) 25 MG XL tablet Take 25 mg by mouth  OLANZapine (ZYPREXA) 5 MG tablet Take 5 mg by mouth at bedtime  omeprazole  (PRILOSEC) 40 MG DR capsule Take one capsule by mouth twice daily as directed.  warfarin (COUMADIN) 5 MG tablet Take 1 tablet (5 mg total) by mouth daily. Stroke prevention, blood thinner   No current facility-administered medications on file prior to visit.   Family History  Problem Relation Age of Onset  Coronary Artery Disease (Blocked arteries around heart) Mother  Diabetes Mother  High blood pressure (Hypertension) Father    Social History   Tobacco Use  Smoking Status Never  Smokeless Tobacco Never    Social History   Socioeconomic History  Marital status: Married  Tobacco Use  Smoking status: Never  Smokeless tobacco: Never  Vaping Use  Vaping status: Never Used  Substance and Sexual Activity  Drug use: Never   Social Drivers of Health   Food Insecurity: Low Risk (01/24/2024)  Received from [facility]  Hunger Vital Sign  Within the past 12 months, you worried that your food  would run out before you got money to buy more: Never true  Within the past 12 months, the food you bought just didn't last and you didn't have money to get more. : Never true  Transportation Needs: No Transportation Needs (01/24/2024)  Received from [employer]  In the past 12 months, has lack of reliable transportation kept you  from medical appointments, meetings, work or from getting things needed for daily living? : No  Stress: No Stress Concern Present (04/23/2021)  Received from [facility] Occupational Health - Occupational Stress Questionnaire  Feeling of Stress : Only a little  Received from [employer]  Social Network  Housing Stability: Unknown (02/16/2024)  Housing Stability Vital Sign  Homeless in the Last Year: No   Objective:  There were no vitals filed for this visit.  There is no height or weight on file to calculate BMI.  Physical Exam Exam conducted with a chaperone present.  HENT:  Head: Normocephalic.  Cardiovascular:  Rate and Rhythm: Normal rate.  Pulmonary:  Effort: Pulmonary effort is normal.  Chest:  Breasts: Right: Mass and tenderness present.  Left: Normal. No mass or tenderness.   Comments: Large hematoma right breast noted. Musculoskeletal:  Cervical back: Normal range of motion.  Lymphadenopathy:  Upper Body:  Right upper body: No supraclavicular or axillary adenopathy.  Left upper body: No supraclavicular or axillary adenopathy.  Skin: General: Skin is warm.  Neurological:  General: No focal deficit present.  Mental Status: She is alert.  Psychiatric:  Mood and Affect: Mood normal.     Labs, Imaging and Diagnostic Testing: 0.8 cm mass right breast 12:00, 0.8 cm mass 12:00 right breast pathology shows low to intermediate grade DCIS. Both ER/PR positive.  Assessment and Plan:   Diagnoses and all orders for this visit:  Ductal carcinoma in situ (DCIS) of right breast   Patient has chosen breast conserving surgery with right breast seed lumpectomy. Risks and benefits reviewed as well as contrasting this to mastectomy reconstruction. Natural history of DCIS as well as local regional recurrence, survival were all reviewed today. Risk of bleeding, infection, seroma formation cosmetic deformity, anesthesia risk need further treatments  and/or additional surgery reviewed today. Hold Coumadin 5 days before surgery  May need cardiac clearance prior to surgery   DEBBY CURTISTINE SHIPPER, MD

## 2024-03-28 NOTE — Anesthesia Postprocedure Evaluation (Signed)
"   Anesthesia Post Note  Patient: Andrea Harding  Procedure(s) Performed: LUMPECTOMY WITH MAGNETIC MARKER LOCALIZATION (Right: Breast)     Patient location during evaluation: PACU Anesthesia Type: General Level of consciousness: awake and alert, oriented and patient cooperative Pain management: pain level controlled Vital Signs Assessment: post-procedure vital signs reviewed and stable Respiratory status: spontaneous breathing, nonlabored ventilation and respiratory function stable Cardiovascular status: blood pressure returned to baseline and stable Postop Assessment: no apparent nausea or vomiting, adequate PO intake and able to ambulate Anesthetic complications: no   No notable events documented.  Last Vitals:  Vitals:   03/28/24 1245 03/28/24 1307  BP: 133/69 (!) 143/80  Pulse: (!) 103 (!) 104  Resp: 10 20  Temp:  (!) 36.3 C  SpO2: 92% 99%    Last Pain:  Vitals:   03/28/24 1307  TempSrc: Temporal  PainSc: 0-No pain                 Cyndie Woodbeck,E. Niya Behler      "

## 2024-03-28 NOTE — Interval H&P Note (Signed)
 History and Physical Interval Note:  03/28/2024 10:24 AM  Andrea Harding  has presented today for surgery, with the diagnosis of DUCTAL CARCINOMA IN SITU, RIGHT BREAST.  The various methods of treatment have been discussed with the patient and family. After consideration of risks, benefits and other options for treatment, the patient has consented to  Procedures: LUMPECTOMY WITH MAGNETIC MARKER LOCALIZATION (Right) as a surgical intervention.  The patient's history has been reviewed, patient examined, no change in status, stable for surgery.  I have reviewed the patient's chart and labs.  Questions were answered to the patient's satisfaction.     Lura Falor A Ayan Yankey

## 2024-04-04 ENCOUNTER — Telehealth: Payer: Self-pay | Admitting: *Deleted

## 2024-04-04 ENCOUNTER — Ambulatory Visit: Payer: Self-pay | Admitting: Surgery

## 2024-04-04 ENCOUNTER — Encounter: Payer: Self-pay | Admitting: *Deleted

## 2024-04-06 LAB — SURGICAL PATHOLOGY

## 2024-04-07 ENCOUNTER — Encounter: Payer: Self-pay | Admitting: *Deleted

## 2024-04-24 ENCOUNTER — Ambulatory Visit: Admitting: Radiation Oncology

## 2024-04-24 ENCOUNTER — Ambulatory Visit

## 2024-04-26 ENCOUNTER — Ambulatory Visit: Admitting: Radiation Oncology

## 2024-04-27 ENCOUNTER — Ambulatory Visit (HOSPITAL_COMMUNITY)
